# Patient Record
Sex: Female | Born: 1970
Health system: Southern US, Community
[De-identification: ages and names within clinical notes are randomized; demographics above are authoritative.]

## PROBLEM LIST (undated history)

## (undated) DIAGNOSIS — I10 Essential (primary) hypertension: Secondary | ICD-10-CM

## (undated) DIAGNOSIS — E785 Hyperlipidemia, unspecified: Secondary | ICD-10-CM

## (undated) DIAGNOSIS — IMO0002 Reserved for concepts with insufficient information to code with codable children: Secondary | ICD-10-CM

## (undated) DIAGNOSIS — R87619 Unspecified abnormal cytological findings in specimens from cervix uteri: Secondary | ICD-10-CM

## (undated) DIAGNOSIS — B977 Papillomavirus as the cause of diseases classified elsewhere: Secondary | ICD-10-CM

## (undated) DIAGNOSIS — O039 Complete or unspecified spontaneous abortion without complication: Secondary | ICD-10-CM

## (undated) HISTORY — PX: KNEE ARTHROCENTESIS: SUR44

## (undated) HISTORY — DX: Hyperlipidemia, unspecified: E78.5

## (undated) HISTORY — DX: Unspecified abnormal cytological findings in specimens from cervix uteri: R87.619

## (undated) HISTORY — PX: TUBAL LIGATION: SHX77

## (undated) HISTORY — PX: CERVICAL BIOPSY  W/ LOOP ELECTRODE EXCISION: SUR135

## (undated) HISTORY — DX: Papillomavirus as the cause of diseases classified elsewhere: B97.7

## (undated) HISTORY — DX: Reserved for concepts with insufficient information to code with codable children: IMO0002

---

## 2011-01-30 ENCOUNTER — Encounter: Payer: Self-pay | Admitting: *Deleted

## 2011-01-30 ENCOUNTER — Emergency Department (HOSPITAL_COMMUNITY)
Admission: EM | Admit: 2011-01-30 | Discharge: 2011-01-30 | Disposition: A | Discharge status: Home / Self Care | Payer: Self-pay | Attending: Emergency Medicine | Admitting: Emergency Medicine

## 2011-01-30 ENCOUNTER — Emergency Department (HOSPITAL_COMMUNITY): Payer: Self-pay

## 2011-01-30 DIAGNOSIS — I10 Essential (primary) hypertension: Secondary | ICD-10-CM | POA: Insufficient documentation

## 2011-01-30 DIAGNOSIS — R109 Unspecified abdominal pain: Secondary | ICD-10-CM | POA: Insufficient documentation

## 2011-01-30 DIAGNOSIS — O200 Threatened abortion: Secondary | ICD-10-CM

## 2011-01-30 HISTORY — DX: Essential (primary) hypertension: I10

## 2011-01-30 LAB — CBC
HCT: 39.6 % (ref 36.0–46.0)
Hemoglobin: 13.8 g/dL (ref 12.0–15.0)
MCH: 31.6 pg (ref 26.0–34.0)
MCHC: 34.8 g/dL (ref 30.0–36.0)
MCV: 90.6 fL (ref 78.0–100.0)

## 2011-01-30 LAB — WET PREP, GENITAL
Trich, Wet Prep: NONE SEEN
Yeast Wet Prep HPF POC: NONE SEEN

## 2011-01-30 LAB — URINALYSIS, ROUTINE W REFLEX MICROSCOPIC
Glucose, UA: NEGATIVE mg/dL
Ketones, ur: NEGATIVE mg/dL
Leukocytes, UA: NEGATIVE
pH: 5.5 (ref 5.0–8.0)

## 2011-01-30 LAB — POCT PREGNANCY, URINE: Preg Test, Ur: POSITIVE

## 2011-01-30 LAB — POCT I-STAT, CHEM 8
BUN: 8 mg/dL (ref 6–23)
Chloride: 105 mEq/L (ref 96–112)
Sodium: 139 mEq/L (ref 135–145)

## 2011-01-30 LAB — HCG, QUANTITATIVE, PREGNANCY: hCG, Beta Chain, Quant, S: 8563 m[IU]/mL — ABNORMAL HIGH (ref <5)

## 2011-01-30 MED ORDER — HYDROCODONE-ACETAMINOPHEN 5-500 MG PO TABS: 1.0000 | ORAL_TABLET | Freq: Four times a day (QID) | ORAL | Status: AC | PRN

## 2011-01-30 MED ORDER — MORPHINE SULFATE 4 MG/ML IJ SOLN
4.0000 mg | Freq: Once | INTRAMUSCULAR | Status: AC
  Administered 2011-01-30: 4 mg via INTRAMUSCULAR
  Filled 2011-01-30: qty 1

## 2011-01-30 MED ORDER — HYDROCODONE-ACETAMINOPHEN 5-325 MG PO TABS
1.0000 | ORAL_TABLET | Freq: Once | ORAL | Status: AC
  Administered 2011-01-30: 1 via ORAL
  Filled 2011-01-30: qty 1

## 2011-01-30 NOTE — ED Provider Notes (Signed)
History     CSN: [id number]  Arrival date & time 01/30/11  1459   First MD Initiated Contact with Patient 01/30/11 1625      Chief Complaint  Patient presents with  . Vaginal Bleeding    (Consider location/radiation/quality/duration/timing/severity/associated sxs/prior treatment) Patient is a 40 y.o. female presenting with vaginal bleeding. The history is provided by the patient.  Vaginal Bleeding This is a new problem. The current episode started yesterday. The problem occurs intermittently. Associated symptoms include abdominal pain. Pertinent negatives include no fatigue, fever, nausea, rash, urinary symptoms, vomiting or weakness. The symptoms are aggravated by nothing. She has tried nothing for the symptoms.  Pt states she is pregnant, about 8wks by LMP. States yesterday developed bright red vaginal bleeding and cramping in the lower abdomen. States this morning bleeding seemed to reslove, but then states she went to the bathroom and has a large amount of brown discharge. Denies clots or passing any tissue. Denies urinary symptoms. Denies, fever, chills, malaise.   Past Medical History  Diagnosis Date  . Hypertension     Past Surgical History  Procedure Date  . Knee arthrocentesis plate and screws    History reviewed. No pertinent family history.  History  Substance Use Topics  . Smoking status: Not on file  . Smokeless tobacco: Not on file  . Alcohol Use: No    OB History    Grav Para Term Preterm Abortions TAB SAB Ect Mult Living   1               Review of Systems  Constitutional: Negative for fever and fatigue.  HENT: Negative.   Eyes: Negative.   Respiratory: Negative.   Gastrointestinal: Positive for abdominal pain. Negative for nausea, vomiting, diarrhea, constipation and blood in stool.  Genitourinary: Positive for vaginal bleeding and pelvic pain. Negative for dysuria, frequency, hematuria and flank pain.  Musculoskeletal: Negative.   Skin: Negative  for rash.  Neurological: Negative.  Negative for weakness.  Psychiatric/Behavioral: Negative.     Allergies  Review of patient's allergies indicates no known allergies.  Home Medications   Current Outpatient Rx  Name Route Sig Dispense Refill  . ADULT MULTIVITAMIN W/MINERALS CH Oral Take 1 tablet by mouth daily.        BP 136/89  Pulse 86  Temp(Src) 98 F (36.7 C) (Oral)  Resp 18  SpO2 99%  LMP 12/01/2010  Physical Exam  Nursing note and vitals reviewed. Constitutional: She is oriented to person, place, and time. She appears well-developed and well-nourished.  HENT:  Head: Normocephalic.  Eyes: Conjunctivae are normal.  Neck: Neck supple.  Cardiovascular: Normal rate, regular rhythm and normal heart sounds.   Pulmonary/Chest: Effort normal. No respiratory distress. She has no wheezes.  Abdominal: Soft. Bowel sounds are normal.       Suprapubic tenderness, no guarding, no rebound tenderness  Genitourinary: Vagina normal. Uterus is enlarged and tender. Cervix exhibits no motion tenderness. Right adnexum displays no mass and no tenderness. Left adnexum displays no mass and no tenderness.       Vaginal bleeding in vaginal canal, with clots. Cervix appears closed.   Musculoskeletal: Normal range of motion. She exhibits no edema.  Neurological: She is alert and oriented to person, place, and time.  Skin: Skin is warm and dry.  Psychiatric: She has a normal mood and affect.    ED Course  Procedures (including critical care time)  Results for orders placed during the hospital encounter of 01/30/11  URINALYSIS,  ROUTINE W REFLEX MICROSCOPIC      Component Value Range   Color, Urine YELLOW  YELLOW    APPearance CLEAR  CLEAR    Specific Gravity, Urine 1.023  1.005 - 1.030    pH 5.5  5.0 - 8.0    Glucose, UA NEGATIVE  NEGATIVE (mg/dL)   Hgb urine dipstick MODERATE (*) NEGATIVE    Bilirubin Urine NEGATIVE  NEGATIVE    Ketones, ur NEGATIVE  NEGATIVE (mg/dL)   Protein, ur  NEGATIVE  NEGATIVE (mg/dL)   Urobilinogen, UA 0.2  0.0 - 1.0 (mg/dL)   Nitrite NEGATIVE  NEGATIVE    Leukocytes, UA NEGATIVE  NEGATIVE   HCG, QUANTITATIVE, PREGNANCY      Component Value Range   hCG, Beta Chain, Quant, S 8563 (*) <5 (mIU/mL)  ABO/RH      Component Value Range   ABO/RH(D) O POS    WET PREP, GENITAL      Component Value Range   Yeast, Wet Prep NONE SEEN  NONE SEEN    Trich, Wet Prep NONE SEEN  NONE SEEN    Clue Cells, Wet Prep NONE SEEN  NONE SEEN    WBC, Wet Prep HPF POC FEW (*) NONE SEEN   CBC      Component Value Range   WBC 10.9 (*) 4.0 - 10.5 (K/uL)   RBC 4.37  3.87 - 5.11 (MIL/uL)   Hemoglobin 13.8  12.0 - 15.0 (g/dL)   HCT 45.4  09.8 - 11.9 (%)   MCV 90.6  78.0 - 100.0 (fL)   MCH 31.6  26.0 - 34.0 (pg)   MCHC 34.8  30.0 - 36.0 (g/dL)   RDW 14.7  82.9 - 56.2 (%)   Platelets 333  150 - 400 (K/uL)  URINE MICROSCOPIC-ADD ON      Component Value Range   Squamous Epithelial / LPF FEW (*) RARE    WBC, UA 0-2  <3 (WBC/hpf)   RBC / HPF 0-2  <3 (RBC/hpf)   Bacteria, UA FEW (*) RARE   POCT PREGNANCY, URINE      Component Value Range   Preg Test, Ur POSITIVE    POCT I-STAT, CHEM 8      Component Value Range   Sodium 139  135 - 145 (mEq/L)   Potassium 3.4 (*) 3.5 - 5.1 (mEq/L)   Chloride 105  96 - 112 (mEq/L)   BUN 8  6 - 23 (mg/dL)   Creatinine, Ser 1.30  0.50 - 1.10 (mg/dL)   Glucose, Bld 86  70 - 99 (mg/dL)   Calcium, Ion 8.65  7.84 - 1.32 (mmol/L)   TCO2 23  0 - 100 (mmol/L)   Hemoglobin 14.6  12.0 - 15.0 (g/dL)   HCT 69.6  29.5 - 28.4 (%)   US Ob Comp Less 14 Wks  01/30/2011  *RADIOLOGY REPORT*  Clinical Data: Pelvic pain.  Rule out ectopic pregnancy or abortion.  Vaginal bleeding for 2 days.  Beta HCG of 8563.  OBSTETRIC <14 WK [place] AND TRANSVAGINAL OB US  Technique:  Both transabdominal and transvaginal ultrasound examinations were performed for complete evaluation of the gestation as well as the maternal uterus, adnexal regions, and pelvic cul-de-sac.   Transvaginal technique was performed to assess early pregnancy.  Comparison:  None.  Intrauterine gestational sac:  Irregularly shaped.  Positioned within the lower uterine segment.  Measures 3.5 x 3.4 cm. Endometrial thickening or blood products more superiorly. Yolk sac: Not visualized Embryo: 12 mm.  This corresponds  to 7 weeks 3 days. Cardiac Activity: Not identified, despite extensive interrogation.  CRL: 12   mm  7   w  3   d  Maternal uterus/adnexae: 2.6 cm left ovarian corpus luteal cyst.  The cervix remains closed.  IMPRESSION: 1.  Findings highly suspicious for nonviable pregnancy.  Irregular gestational sac positioned within the lower uterine segment with a 12 mm fetal pole but no evidence of cardiac activity. 2.  Left ovarian corpus luteal cyst.  Findings called to Onalee Hua, NP  Original Report Authenticated By: Consuello Bossier, M.D.   US Ob Transvaginal  01/30/2011  *RADIOLOGY REPORT*  Clinical Data: Pelvic pain.  Rule out ectopic pregnancy or abortion.  Vaginal bleeding for 2 days.  Beta HCG of 8563.  OBSTETRIC <14 WK [place] AND TRANSVAGINAL OB US  Technique:  Both transabdominal and transvaginal ultrasound examinations were performed for complete evaluation of the gestation as well as the maternal uterus, adnexal regions, and pelvic cul-de-sac.  Transvaginal technique was performed to assess early pregnancy.  Comparison:  None.  Intrauterine gestational sac:  Irregularly shaped.  Positioned within the lower uterine segment.  Measures 3.5 x 3.4 cm. Endometrial thickening or blood products more superiorly. Yolk sac: Not visualized Embryo: 12 mm.  This corresponds to 7 weeks 3 days. Cardiac Activity: Not identified, despite extensive interrogation.  CRL: 12   mm  7   w  3   d  Maternal uterus/adnexae: 2.6 cm left ovarian corpus luteal cyst.  The cervix remains closed.  IMPRESSION: 1.  Findings highly suspicious for nonviable pregnancy.  Irregular gestational sac positioned within the lower uterine segment  with a 12 mm fetal pole but no evidence of cardiac activity. 2.  Left ovarian corpus luteal cyst.  Findings called to Onalee Hua, NP  Original Report Authenticated By: Consuello Bossier, M.D.    8:45 PM [place] suspicious for nonviable pregnancy. Cervix closed. Suspect threatened miscarriage. Hgb 10.9. VS normal.  Spoke with Dr. Michele Mcalpine, will follow up outpatient in the office. Asked to call the office tomorrow. Results and follow up discussed with pt, she voiced understanding. Precautions given as to when to return to the ER.   MDM          Lottie Mussel, PA 01/30/11 2047

## 2011-01-30 NOTE — ED Notes (Signed)
Pt in u/s

## 2011-01-30 NOTE — ED Notes (Signed)
Patient states she took a pregnancy test x 1 week ago and it was positive. Patient states she went to use the restroom around 6 pm and noticed bright red blood in to toilet and on the tissue. Today she had had dark brown blood in the toilet and on the tissue when she urinates. Patient states she started having cramps yesterday and today the cramps are worse.

## 2011-01-30 NOTE — ED Notes (Signed)
G2P1. LMP OCT 9. Vag bleeding since last night. No clots or tissue observed per pt. Assisted w/ pelvic

## 2011-01-31 NOTE — ED Provider Notes (Signed)
Medical screening examination/treatment/procedure(s) were performed by non-physician practitioner and as supervising physician I was immediately available for consultation/collaboration.  Flint Melter, MD 01/31/11 Burna Mortimer

## 2011-03-05 ENCOUNTER — Emergency Department (HOSPITAL_COMMUNITY)
Admission: EM | Admit: 2011-03-05 | Discharge: 2011-03-05 | Disposition: A | Discharge status: Home / Self Care | Payer: BC Managed Care – PPO | Source: Home / Self Care | Attending: Family Medicine | Admitting: Family Medicine

## 2011-03-05 ENCOUNTER — Encounter (HOSPITAL_COMMUNITY): Payer: Self-pay | Admitting: *Deleted

## 2011-03-05 DIAGNOSIS — L509 Urticaria, unspecified: Secondary | ICD-10-CM

## 2011-03-05 DIAGNOSIS — L309 Dermatitis, unspecified: Secondary | ICD-10-CM

## 2011-03-05 DIAGNOSIS — L259 Unspecified contact dermatitis, unspecified cause: Secondary | ICD-10-CM

## 2011-03-05 HISTORY — DX: Complete or unspecified spontaneous abortion without complication: O03.9

## 2011-03-05 MED ORDER — TRIAMCINOLONE ACETONIDE 0.1 % EX OINT
TOPICAL_OINTMENT | Freq: Two times a day (BID) | CUTANEOUS | Status: AC
Start: 2011-03-05 — End: 2012-03-04

## 2011-03-05 NOTE — ED Provider Notes (Signed)
History     CSN: [id number]  Arrival date & time 03/05/11  1107   First MD Initiated Contact with Patient 03/05/11 1140      Chief Complaint  Patient presents with  . Rash    (Consider location/radiation/quality/duration/timing/severity/associated sxs/prior treatment) HPI Comments: Bridget Fields presents for evaluation of intermittent, pruritic lesions over her body, in various places. She reports new onset of several lesions on her LEFT hand today. Over the last 2 weeks, she reports lesions over her trunk, abdomen, RIGHT thigh. She reports intense itching without pain. She denies adamantly any new exposure.   Patient is a 41 y.o. female presenting with rash. The history is provided by the patient.  Rash  This is a new problem. The current episode started more than 1 week ago. The problem has not changed since onset.The problem is associated with an unknown factor. There has been no fever. The rash is present on the torso, trunk, right arm, right wrist, right upper leg and left hand. Associated symptoms include itching. Pertinent negatives include no blisters and no pain. She has tried nothing for the symptoms.    Past Medical History  Diagnosis Date  . Hypertension   . Miscarriage     Past Surgical History  Procedure Date  . Knee arthrocentesis plate and screws    History reviewed. No pertinent family history.  History  Substance Use Topics  . Smoking status: Current Everyday Smoker  . Smokeless tobacco: Not on file  . Alcohol Use: No    OB History    Grav Para Term Preterm Abortions TAB SAB Ect Mult Living   1               Review of Systems  Constitutional: Negative.   HENT: Negative.   Eyes: Negative.   Respiratory: Negative.   Cardiovascular: Negative.   Gastrointestinal: Negative.   Genitourinary: Negative.   Musculoskeletal: Negative.   Skin: Positive for itching and rash.  Neurological: Negative.     Allergies  Review of patient's allergies indicates no  known allergies.  Home Medications   Current Outpatient Rx  Name Route Sig Dispense Refill  . ALPRAZOLAM 0.25 MG PO TABS Oral Take 0.25 mg by mouth at bedtime as needed.    Marland Kitchen HYDROCHLOROTHIAZIDE 25 MG PO TABS Oral Take 12.5 mg by mouth daily.    . ADULT MULTIVITAMIN W/MINERALS CH Oral Take 1 tablet by mouth daily.      . TRIAMCINOLONE ACETONIDE 0.1 % EX OINT Topical Apply topically 2 (two) times daily. 45 g 1    BP 130/84  Pulse 82  Temp(Src) 98.4 F (36.9 C) (Oral)  Resp 16  SpO2 100%  LMP 01/30/2011  Breastfeeding? Unknown  Physical Exam  Nursing note and vitals reviewed. Constitutional: She is oriented to person, place, and time. She appears well-developed and well-nourished.  HENT:  Head: Normocephalic and atraumatic.  Eyes: EOM are normal.  Neck: Normal range of motion.  Pulmonary/Chest: Effort normal.  Musculoskeletal: Normal range of motion.  Neurological: She is alert and oriented to person, place, and time.  Skin: Skin is warm and dry. Rash noted. Rash is urticarial.     Psychiatric: Her behavior is normal.    ED Course  Procedures (including critical care time)  Labs Reviewed - No data to display No results found.   1. Dermatitis   2. Urticaria       MDM  Given rx for triamcinolone ointment        Richardo Priest,  MD 03/05/11 1232

## 2011-03-05 NOTE — ED Notes (Signed)
Pt had miscarriage December 27th - onset of rash x 2 weeks ago started on right side abd now on arms/legs and hands - itching burning sensation

## 2012-04-12 ENCOUNTER — Other Ambulatory Visit: Payer: Self-pay

## 2012-04-12 DIAGNOSIS — Z1231 Encounter for screening mammogram for malignant neoplasm of breast: Secondary | ICD-10-CM

## 2012-04-12 DIAGNOSIS — Z803 Family history of malignant neoplasm of breast: Secondary | ICD-10-CM

## 2012-05-03 ENCOUNTER — Ambulatory Visit
Admission: RE | Admit: 2012-05-03 | Discharge: 2012-05-03 | Disposition: A | Discharge status: Home / Self Care | Payer: Commercial Managed Care - PPO | Source: Ambulatory Visit

## 2012-05-03 DIAGNOSIS — Z803 Family history of malignant neoplasm of breast: Secondary | ICD-10-CM

## 2012-05-03 DIAGNOSIS — Z1231 Encounter for screening mammogram for malignant neoplasm of breast: Secondary | ICD-10-CM

## 2013-01-04 ENCOUNTER — Encounter: Payer: Self-pay | Admitting: Certified Nurse Midwife

## 2013-01-04 ENCOUNTER — Ambulatory Visit (INDEPENDENT_AMBULATORY_CARE_PROVIDER_SITE_OTHER): Payer: Commercial Managed Care - PPO | Admitting: Certified Nurse Midwife

## 2013-01-04 VITALS — BP 120/80 | HR 72 | Resp 16 | Ht 62.75 in | Wt 219.0 lb

## 2013-01-04 DIAGNOSIS — N912 Amenorrhea, unspecified: Secondary | ICD-10-CM

## 2013-01-04 DIAGNOSIS — Z3201 Encounter for pregnancy test, result positive: Secondary | ICD-10-CM

## 2013-01-04 LAB — POCT URINE PREGNANCY: Preg Test, Ur: POSITIVE

## 2013-01-04 MED ORDER — CITRANATAL HARMONY 30-1-260 MG PO CAPS: 1.0000 | ORAL_CAPSULE | Freq: Every day | ORAL | Status: DC

## 2013-01-04 NOTE — Progress Notes (Signed)
  42 y.o.Married [place] American female presents with no menses sinceOctober 21, 2014 with positive UPT here.  Currently periods are occurring every 28 days.. Planned pregnancy, but had not stopped smoking or drinking prior to conception( did not think it would happen this soon). Has stopped smoking and alcohol use now.Taking over the counter vitamins. Denies any other medication use except antacid. Denies vomiting or nausea. Complaining of breast tenderness only. Eating well balanced diet, with water and minimal coffee/ tea use. 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 Has not had flu shot yet. First pregnancy 20 years ago with pre-eclampsia. Patient and spouse very excited.  Pertinent items are noted in HPI.   O: Last aex 02/19/12 all WNL except ASCUS pap HPV negative. History of abnormal pap with LEEP in 1992 with normal paps. Healthy WDWN obese female Affect: Orientation x 3    A: Amenorrhea with positive UPT at 7 weeks per LMP, EDC 08/31/13 per LMP only History of pre eclampsia first pregnancy Smoker 1 ppd with recent cessation Alcohol use with recent cessation History abnormal pap with LEEP and ASCUS - HPVHR pap 1/14 with follow up pap with HPVHR needed 1/15  P:Discussed importance of early prenatal care and genetic screening options available. Given referral list and importance of scheduling soon. Patient plans to call today. Patient will notify our office of appointment and records will be sent. Discussed importance of good nutrition and foods to avoid in pregnancy. Warning signs and symptoms of early pregnancy given and need to advise. Stressed importance of no smoking or alcohol use and concerns with pregnancy and fetal development. Rx given for Prenatal vitamins. Wished well with pregnancy Questions addressed at length.  35 minutes spent with patient with >50% of time spent in face to face counseling.

## 2013-01-05 NOTE — Progress Notes (Signed)
Note reviewed, agree with plan.  Fuad Forget, MD  

## 2013-01-17 ENCOUNTER — Telehealth: Payer: Self-pay | Admitting: Certified Nurse Midwife

## 2013-01-17 NOTE — Telephone Encounter (Signed)
OB records sent to  [facility] OB GYN Appt 01/26/13 @10 :30

## 2013-02-18 LAB — OB RESULTS CONSOLE RUBELLA ANTIBODY, IGM: Rubella: IMMUNE

## 2013-02-18 LAB — OB RESULTS CONSOLE GC/CHLAMYDIA
CHLAMYDIA, DNA PROBE: NEGATIVE
Gonorrhea: NEGATIVE

## 2013-02-18 LAB — OB RESULTS CONSOLE HIV ANTIBODY (ROUTINE TESTING): HIV: NONREACTIVE

## 2013-02-18 LAB — OB RESULTS CONSOLE RPR: RPR: NONREACTIVE

## 2013-02-18 LAB — OB RESULTS CONSOLE HEPATITIS B SURFACE ANTIGEN: Hepatitis B Surface Ag: NEGATIVE

## 2013-03-01 ENCOUNTER — Ambulatory Visit: Payer: Self-pay | Admitting: Certified Nurse Midwife

## 2013-05-10 ENCOUNTER — Encounter (HOSPITAL_COMMUNITY): Payer: Self-pay | Admitting: *Deleted

## 2013-05-10 ENCOUNTER — Inpatient Hospital Stay (HOSPITAL_COMMUNITY)
Admission: AD | Admit: 2013-05-10 | Discharge: 2013-05-10 | Disposition: A | Discharge status: Home / Self Care | Payer: Commercial Managed Care - PPO | Source: Ambulatory Visit | Attending: Obstetrics and Gynecology | Admitting: Obstetrics and Gynecology

## 2013-05-10 DIAGNOSIS — Z8249 Family history of ischemic heart disease and other diseases of the circulatory system: Secondary | ICD-10-CM | POA: Insufficient documentation

## 2013-05-10 DIAGNOSIS — O169 Unspecified maternal hypertension, unspecified trimester: Secondary | ICD-10-CM | POA: Insufficient documentation

## 2013-05-10 DIAGNOSIS — IMO0002 Reserved for concepts with insufficient information to code with codable children: Secondary | ICD-10-CM

## 2013-05-10 DIAGNOSIS — Z87891 Personal history of nicotine dependence: Secondary | ICD-10-CM | POA: Insufficient documentation

## 2013-05-10 LAB — COMPREHENSIVE METABOLIC PANEL
ALK PHOS: 90 U/L (ref 39–117)
ALT: 38 U/L — AB (ref 0–35)
AST: 29 U/L (ref 0–37)
Albumin: 2.7 g/dL — ABNORMAL LOW (ref 3.5–5.2)
BUN: 5 mg/dL — AB (ref 6–23)
CALCIUM: 9.3 mg/dL (ref 8.4–10.5)
CO2: 25 mEq/L (ref 19–32)
Chloride: 103 mEq/L (ref 96–112)
Creatinine, Ser: 0.62 mg/dL (ref 0.50–1.10)
Glucose, Bld: 98 mg/dL (ref 70–99)
Potassium: 3.4 mEq/L — ABNORMAL LOW (ref 3.7–5.3)
Sodium: 140 mEq/L (ref 137–147)
TOTAL PROTEIN: 7 g/dL (ref 6.0–8.3)
Total Bilirubin: 0.3 mg/dL (ref 0.3–1.2)

## 2013-05-10 LAB — URINALYSIS, ROUTINE W REFLEX MICROSCOPIC
Bilirubin Urine: NEGATIVE
GLUCOSE, UA: NEGATIVE mg/dL
HGB URINE DIPSTICK: NEGATIVE
KETONES UR: NEGATIVE mg/dL
Leukocytes, UA: NEGATIVE
Nitrite: NEGATIVE
Protein, ur: NEGATIVE mg/dL
Specific Gravity, Urine: 1.01 (ref 1.005–1.030)
Urobilinogen, UA: 0.2 mg/dL (ref 0.0–1.0)
pH: 7.5 (ref 5.0–8.0)

## 2013-05-10 LAB — CBC
HEMATOCRIT: 32.5 % — AB (ref 36.0–46.0)
HEMOGLOBIN: 10.8 g/dL — AB (ref 12.0–15.0)
MCH: 30.9 pg (ref 26.0–34.0)
MCHC: 33.2 g/dL (ref 30.0–36.0)
MCV: 93.1 fL (ref 78.0–100.0)
Platelets: 312 10*3/uL (ref 150–400)
RBC: 3.49 MIL/uL — AB (ref 3.87–5.11)
RDW: 14.2 % (ref 11.5–15.5)
WBC: 14.4 10*3/uL — ABNORMAL HIGH (ref 4.0–10.5)

## 2013-05-10 LAB — LACTATE DEHYDROGENASE: LDH: 207 U/L (ref 94–250)

## 2013-05-10 LAB — URIC ACID: Uric Acid, Serum: 3.5 mg/dL (ref 2.4–7.0)

## 2013-05-10 NOTE — MAU Provider Note (Signed)
History     CSN: [id number]  Arrival date and time: 05/10/13 1245   First Provider Initiated Contact with Patient 05/10/13 1342      Chief Complaint  Patient presents with  . Hypertension   HPI  Pt is G3P1011 at [redacted]w[redacted]d GA was seen in the office this morning with BP 160/100 and is here for PIH eval. Pt has a 41 yo daughter that was delivered at 32 weeks due to hypertension.  Pt has had an uneventful pregnancy until this time. Pt has had headaches but not at this time.  Pt had some nausea and vomiting yesterday but not today.  Pt has eaten a bowl of oatmeal Today.  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   Past Medical History  Diagnosis Date  . Hypertension   . Miscarriage   . Abnormal Pap smear 1992 2014    1992 Leep, 02/2012 ASCUS HPV neg  . HPV in female     Past Surgical History  Procedure Laterality Date  . Knee arthrocentesis  plate and screws  . Cervical biopsy  w/ loop electrode excision      Family History  Problem Relation Age of Onset  . Hypertension Mother   . Diabetes Mother   . Breast cancer Mother 66  . Heart disease Father   . Hypertension Maternal Grandmother   . Hypertension Maternal Grandfather     History  Substance Use Topics  . Smoking status: Former Research scientist (life sciences)  . Smokeless tobacco: Not on file  . Alcohol Use: No    Allergies:  Allergies  Allergen Reactions  . Aspirin Hives    Prescriptions prior to admission  Medication Sig Dispense Refill  . Prenatal Vit-Fe Fumarate-FA (PRENATAL MULTIVITAMIN) TABS tablet Take 1 tablet by mouth daily at 12 noon.        Review of Systems  Constitutional: Negative for fever and chills.  Gastrointestinal: Negative for nausea, vomiting, abdominal pain and diarrhea.  Genitourinary: Negative for dysuria.  Neurological: Negative for headaches.   Physical Exam   Blood pressure 146/82, pulse 78, temperature 98.4 F (36.9 C), temperature source Oral, resp. rate 18, height 5\' 3"  (1.6 m),  weight 105.325 kg (232 lb 3.2 oz), last menstrual period 11/23/2012, SpO2 100.00%.  Physical Exam  Nursing note and vitals reviewed. Constitutional: She is oriented to person, place, and time. She appears well-developed and well-nourished. No distress.  HENT:  Head: Normocephalic.  Eyes: Pupils are equal, round, and reactive to light.  Neck: Normal range of motion. Neck supple.  Cardiovascular: Normal rate.   hypertensive  Respiratory: Effort normal.  GI: Soft.  Musculoskeletal: Normal range of motion. She exhibits no edema.  Neurological: She is alert and oriented to person, place, and time.  Skin: Skin is warm and dry.  Psychiatric: She has a normal mood and affect.    MAU Course  Procedures Results for orders placed during the hospital encounter of 05/10/13 (from the past 24 hour(s))  CBC     Status: Abnormal   Collection Time    05/10/13  1:02 PM      Result Value Ref Range   WBC 14.4 (*) 4.0 - 10.5 K/uL   RBC 3.49 (*) 3.87 - 5.11 MIL/uL   Hemoglobin 10.8 (*) 12.0 - 15.0 g/dL   HCT 32.5 (*) 36.0 - 46.0 %   MCV 93.1  78.0 - 100.0 fL   MCH 30.9  26.0 - 34.0 pg   MCHC 33.2  30.0 -  36.0 g/dL   RDW 14.2  11.5 - 15.5 %   Platelets 312  150 - 400 K/uL  COMPREHENSIVE METABOLIC PANEL     Status: Abnormal   Collection Time    05/10/13  1:02 PM      Result Value Ref Range   Sodium 140  137 - 147 mEq/L   Potassium 3.4 (*) 3.7 - 5.3 mEq/L   Chloride 103  96 - 112 mEq/L   CO2 25  19 - 32 mEq/L   Glucose, Bld 98  70 - 99 mg/dL   BUN 5 (*) 6 - 23 mg/dL   Creatinine, Ser 0.62  0.50 - 1.10 mg/dL   Calcium 9.3  8.4 - 10.5 mg/dL   Total Protein 7.0  6.0 - 8.3 g/dL   Albumin 2.7 (*) 3.5 - 5.2 g/dL   AST 29  0 - 37 U/L   ALT 38 (*) 0 - 35 U/L   Alkaline Phosphatase 90  39 - 117 U/L   Total Bilirubin 0.3  0.3 - 1.2 mg/dL   GFR calc non Af Amer >90  >90 mL/min   GFR calc Af Amer >90  >90 mL/min  URIC ACID     Status: None   Collection Time    05/10/13  1:02 PM      Result Value  Ref Range   Uric Acid, Serum 3.5  2.4 - 7.0 mg/dL  LACTATE DEHYDROGENASE     Status: None   Collection Time    05/10/13  1:02 PM      Result Value Ref Range   LDH 207  94 - 250 U/L  URINALYSIS, ROUTINE W REFLEX MICROSCOPIC     Status: None   Collection Time    05/10/13  1:10 PM      Result Value Ref Range   Color, Urine YELLOW  YELLOW   APPearance CLEAR  CLEAR   Specific Gravity, Urine 1.010  1.005 - 1.030   pH 7.5  5.0 - 8.0   Glucose, UA NEGATIVE  NEGATIVE mg/dL   Hgb urine dipstick NEGATIVE  NEGATIVE   Bilirubin Urine NEGATIVE  NEGATIVE   Ketones, ur NEGATIVE  NEGATIVE mg/dL   Protein, ur NEGATIVE  NEGATIVE mg/dL   Urobilinogen, UA 0.2  0.0 - 1.0 mg/dL   Nitrite NEGATIVE  NEGATIVE   Leukocytes, UA NEGATIVE  NEGATIVE   Discussed with Dr. Rogue Bussing- will send home with 24 hr urine starting tomorrow morning and f/u in Office to see her provider on Thursday   Assessment and Plan  Hypertension in pregnancy- nl PIH labs 24 hr urine collection and f/u in office on Thursday   Denyse Fillion 05/10/2013, 1:42 PM

## 2013-05-10 NOTE — MAU Note (Signed)
Pt sent from MD office, BP was 160/100.  Pt denies HA, visual changes, epigastric pain, or edema.  Also denies uc's bleeding or LOF.

## 2013-05-12 ENCOUNTER — Other Ambulatory Visit: Payer: Self-pay

## 2013-05-17 ENCOUNTER — Ambulatory Visit (HOSPITAL_COMMUNITY)
Admission: RE | Admit: 2013-05-17 | Discharge: 2013-05-17 | Disposition: A | Discharge status: Home / Self Care | Payer: Commercial Managed Care - PPO | Source: Ambulatory Visit | Attending: Obstetrics and Gynecology | Admitting: Obstetrics and Gynecology

## 2013-05-17 ENCOUNTER — Encounter (HOSPITAL_COMMUNITY): Payer: Self-pay | Admitting: General Practice

## 2013-05-17 ENCOUNTER — Inpatient Hospital Stay (HOSPITAL_COMMUNITY)
Admission: AD | Admit: 2013-05-17 | Discharge: 2013-05-17 | Disposition: A | Discharge status: Home / Self Care | Payer: Commercial Managed Care - PPO | Source: Ambulatory Visit | Attending: Obstetrics and Gynecology | Admitting: Obstetrics and Gynecology

## 2013-05-17 ENCOUNTER — Ambulatory Visit (HOSPITAL_COMMUNITY): Admit: 2013-05-17 | Payer: Commercial Managed Care - PPO

## 2013-05-17 ENCOUNTER — Inpatient Hospital Stay (HOSPITAL_COMMUNITY)
Admission: AD | Admit: 2013-05-17 | Discharge: 2013-05-21 | DRG: 781 | Disposition: A | Discharge status: Home / Self Care | Payer: Commercial Managed Care - PPO | Source: Ambulatory Visit | Attending: Obstetrics and Gynecology | Admitting: Obstetrics and Gynecology

## 2013-05-17 ENCOUNTER — Encounter (HOSPITAL_COMMUNITY): Payer: Self-pay | Admitting: *Deleted

## 2013-05-17 ENCOUNTER — Ambulatory Visit (HOSPITAL_COMMUNITY)
Admit: 2013-05-17 | Discharge: 2013-05-17 | Disposition: A | Discharge status: Home / Self Care | Payer: Commercial Managed Care - PPO | Attending: Obstetrics and Gynecology | Admitting: Obstetrics and Gynecology

## 2013-05-17 DIAGNOSIS — O10019 Pre-existing essential hypertension complicating pregnancy, unspecified trimester: Secondary | ICD-10-CM | POA: Insufficient documentation

## 2013-05-17 DIAGNOSIS — Z87891 Personal history of nicotine dependence: Secondary | ICD-10-CM

## 2013-05-17 DIAGNOSIS — Z833 Family history of diabetes mellitus: Secondary | ICD-10-CM

## 2013-05-17 DIAGNOSIS — Z8249 Family history of ischemic heart disease and other diseases of the circulatory system: Secondary | ICD-10-CM | POA: Insufficient documentation

## 2013-05-17 DIAGNOSIS — O09529 Supervision of elderly multigravida, unspecified trimester: Secondary | ICD-10-CM | POA: Diagnosis present

## 2013-05-17 DIAGNOSIS — O139 Gestational [pregnancy-induced] hypertension without significant proteinuria, unspecified trimester: Secondary | ICD-10-CM

## 2013-05-17 DIAGNOSIS — O99810 Abnormal glucose complicating pregnancy: Secondary | ICD-10-CM | POA: Diagnosis present

## 2013-05-17 LAB — CBC
HEMATOCRIT: 32.2 % — AB (ref 36.0–46.0)
Hemoglobin: 10.7 g/dL — ABNORMAL LOW (ref 12.0–15.0)
MCH: 30.8 pg (ref 26.0–34.0)
MCHC: 33.2 g/dL (ref 30.0–36.0)
MCV: 92.8 fL (ref 78.0–100.0)
PLATELETS: 287 10*3/uL (ref 150–400)
RBC: 3.47 MIL/uL — ABNORMAL LOW (ref 3.87–5.11)
RDW: 14.2 % (ref 11.5–15.5)
WBC: 12.2 10*3/uL — ABNORMAL HIGH (ref 4.0–10.5)

## 2013-05-17 LAB — COMPREHENSIVE METABOLIC PANEL
ALBUMIN: 2.5 g/dL — AB (ref 3.5–5.2)
ALT: 39 U/L — ABNORMAL HIGH (ref 0–35)
AST: 27 U/L (ref 0–37)
Alkaline Phosphatase: 92 U/L (ref 39–117)
BUN: 5 mg/dL — AB (ref 6–23)
CALCIUM: 9.1 mg/dL (ref 8.4–10.5)
CO2: 22 mEq/L (ref 19–32)
Chloride: 104 mEq/L (ref 96–112)
Creatinine, Ser: 0.64 mg/dL (ref 0.50–1.10)
GFR calc Af Amer: >90 mL/min (ref >90)
GFR calc non Af Amer: >90 mL/min (ref >90)
Glucose, Bld: 86 mg/dL (ref 70–99)
Potassium: 3.2 mEq/L — ABNORMAL LOW (ref 3.7–5.3)
Sodium: 139 mEq/L (ref 137–147)
TOTAL PROTEIN: 6.4 g/dL (ref 6.0–8.3)
Total Bilirubin: 0.4 mg/dL (ref 0.3–1.2)

## 2013-05-17 LAB — PROTEIN / CREATININE RATIO, URINE
CREATININE, URINE: 127.75 mg/dL
PROTEIN CREATININE RATIO: 0.16 — AB (ref 0.00–0.15)
TOTAL PROTEIN, URINE: 20.2 mg/dL

## 2013-05-17 MED ORDER — DOCUSATE SODIUM 100 MG PO CAPS
100.0000 mg | ORAL_CAPSULE | Freq: Every day | ORAL | Status: DC
  Filled 2013-05-17 (×3): qty 1

## 2013-05-17 MED ORDER — BETAMETHASONE SOD PHOS & ACET 6 (3-3) MG/ML IJ SUSP
12.0000 mg | Freq: Once | INTRAMUSCULAR | Status: DC
  Filled 2013-05-17: qty 2

## 2013-05-17 MED ORDER — BETAMETHASONE SOD PHOS & ACET 6 (3-3) MG/ML IJ SUSP
12.0000 mg | Freq: Once | INTRAMUSCULAR | Status: AC
  Administered 2013-05-17: 12 mg via INTRAMUSCULAR
  Filled 2013-05-17: qty 2

## 2013-05-17 MED ORDER — PRENATAL MULTIVITAMIN CH
1.0000 | ORAL_TABLET | Freq: Every day | ORAL | Status: DC
  Administered 2013-05-18 – 2013-05-20 (×3): 1 via ORAL
  Filled 2013-05-17 (×3): qty 1

## 2013-05-17 MED ORDER — BETAMETHASONE SOD PHOS & ACET 6 (3-3) MG/ML IJ SUSP
12.0000 mg | Freq: Once | INTRAMUSCULAR | Status: AC
  Administered 2013-05-18: 12 mg via INTRAMUSCULAR
  Filled 2013-05-17: qty 2

## 2013-05-17 MED ORDER — CALCIUM CARBONATE ANTACID 500 MG PO CHEW: 2.0000 | CHEWABLE_TABLET | ORAL | Status: DC | PRN

## 2013-05-17 MED ORDER — ACETAMINOPHEN 325 MG PO TABS: 650.0000 mg | ORAL_TABLET | ORAL | Status: DC | PRN

## 2013-05-17 MED ORDER — ZOLPIDEM TARTRATE 5 MG PO TABS: 5.0000 mg | ORAL_TABLET | Freq: Every evening | ORAL | Status: DC | PRN

## 2013-05-17 NOTE — H&P (Signed)
Pt is a 43 yr old black female, G3P0111 at 25 weeks who is admitted for HTN. Dr. Leonides Sake wrote an excellent and thorough consult. Will follow suggestions.

## 2013-05-17 NOTE — Progress Notes (Signed)
[facility] ultrasound  Indication: 43 yr old G3P0111 at [redacted]w[redacted]d with chronic hypertension with supsicion for superimposed gestational hypertension for fetal ultrasound.  Findings: 1. Single intrauterine pregnancy. 2. Estimated fetal weight is in the 61st%. 3. Anterior placenta without evidence of previa. 4. Normal amniotic fluid index; although increased for gestational age. 5. Normal transabdominal cervical length. 6. The views of the cervical spine are limited. 7. The remainder of the limited anatomy survey is normal.  Recommendations: 1. Appropriate fetal growth. 2. Limited anatomy survey: - recommend reevaluate spine on follow up ultrasound 3. Advanced maternal age: - see consult letter - had normal cell free fetal DNA 4. Slightly increased AFI: - has not had glucola yet -  in light of recent betamethasone recommend wait 1-2 weeks to do glucola - may consider checking sugars while inpatient- patient has strong family history of diabetes 5. Hypertension: - see consult letter - recommend fetal growth every 4 weeks - recommend start antenatal testing at 32 weeks or sooner if clinically indicated - delivery timing recommendations based on clinical picture  [place], MD

## 2013-05-17 NOTE — MAU Note (Signed)
PT ARRIVED  FROM DR'S OFFICE. -  BP  - 150/100,  130/92-  COLLECTED URINE    WAS IN MAU LAST WEEK-   COLLECTED 24 HR URINE-  DOES NOT KNOW RESULTS.   NO VAG BLEEDING.  VOMITED YESTERDAY. DENIES H/A, BLURRED VISION, CHEST PAIN.  NO SWELLING IN FEET/LEGS.  PT SAYS SMALL AMT IN SWELLING IN HANDS-   RINGS STILL FIT.

## 2013-05-17 NOTE — Plan of Care (Signed)
Problem: Consults Goal: Birthing Suites Patient Information Press F2 to bring up selections list   Pt < [redacted] weeks EGA     

## 2013-05-17 NOTE — Consult Note (Signed)
MFM consult  43 yr old G3P0111 at [redacted]w[redacted]d referred by Dr. Ouida Sills for consult secondary to hypertension. The patient has been evaluated twice in the last week for elevated BPs. Has had normal labs today and on 05/10/13. Patient reports no headache, vision changes, or abdominal pain  Past OB hx: prior vaginal delivery at 32 weeks for preeclampsia- 1992; 2012 first trimester SAB PMH: diagnosed with hypertension (possibly borderline) 6-7 years ago and was on a diuretic- however reports blood pressures were improved and she stopped the medication several years ago and blood pressures have been normal since PSH: knee surgery Meds: PNV Allergies: aspirin Social hx: stopped tobacco at beginning of pregnancy; no alcohol or illicit drug use Family hx: mother with diabetes; HTN runs in family  O: BP 170/96 repeat 157/94 Labs done today are normal; 24 hour urine on 05/13/13 normal with 226mg  protein in 1500cc of urine  Ultrasound today shows appropriate fetal growth and normal limited anatomy survey  I counseled the patient as follows: 1. Hypertension: - Patient likely has chronic hypertension, however discussed that her blood pressures are elevated over baseline (had been normal in pregnancy until 1 week ago) therefore unclear if worsening chronic hypertension or superimposed gestational hypertension - given uncertainty recommend admission for 24 hours to trend BPs, repeat labs in am, and obtain a urine protein/creatinine ratio- if borderline recommend repeat 24 hour urine - I discussed that women with preexistent hypertension are at increased risk of adverse pregnancy outcome, including preeclampsia, abruption, fetal growth restriction, and perinatal death. The risk increases with severity of hypertension and presence of end-organ damage. Risk of superimposed preeclampsia is 10 to 25 %; risk of abruption is 0.7 to 1.5 %; and risk of fetal growth restriction is 8 to 16 %. I also discussed that risk of preterm  delivery is increased and is usually iatrogenic from the complications listed above. Risk of preterm delivery in women with chronic hypertension is 12-34%. There is also an increased risk of requiring C section for the above reasons. .I would recommend targeting therapy to keep systolic blood pressures <017P and diastolic blood pressures <102. If patient has persistent BPs in upper 150s/100s or severe range recommend starting antihypertensive; may start with labetalol 200mg  bid. I recommend serial ultrasounds for fetal growth every 4 weeks starting at [redacted] weeks gestation. I recommend close surveillance for the development of signs/symptoms of preeclampsia. I recommend antenatal testing to start at [redacted] weeks gestation or sooner if clinically indicated. Patient is receiving a course of betamethasone.  Delivery timing recommendations based on clinical picture.  If it is determined that patient has early onset preeclampsia recommend obtain antiphospholipid antibody panel (lupus anticoagulant, anticardiolipin antibody, and beta 2 glycoprotein) especially given history of early onset preeclampsia in prior pregnancy.  Will reconsult tomorrow to determine if patient may be managed as outpatient or should continue inpatient management based on trend in blood pressures and repeat labs. 2. Advanced maternal age: - discussed increased risk of fetal aneuploidy - patient previously counseled by primary OB and had normal cell free fetal DNA  With maternal age over 37 there is increased risk of gestational diabetes, fetal growth restriction, need for Cesarean delivery, and stillbirth. Recommend serial ultrasounds for fetal growth every 4-6 weeks. Recommend starting fetal kick counts at [redacted] weeks gestation. Recommend antenatal testing with either weekly biophysical profiles or twice weekly nonstress tests and weekly amniotic fluid index starting at 32 weeks- or sooner if clinically indicated Recommend delivery by  estimated due date  I spent a total of 45 minutes with the patient of which >50% was spent in face to face consultation.  Please call with questions.  Recommendations discussed with Dr. Ouida Sills who is in agreement with the plan. Patient was sent to antepartum for admission.  [place], MD

## 2013-05-17 NOTE — MAU Provider Note (Signed)
  History     CSN: [id number]  Arrival date and time: 05/17/13 1122   None     No chief complaint on file.  HPI  Pt is a 43 yo G3P0111 at [redacted]w[redacted]d wks IUP sent over from office due to elevated blood pressure.  24 hr urine results from last week pending.  Denies vaginal bleeding or contractions.  No report of headache, vision changes or epigastric.  Here for labs and BMZ injection.     Past Medical History  Diagnosis Date  . Hypertension   . Miscarriage   . Abnormal Pap smear 1992 2014    1992 Leep, 02/2012 ASCUS HPV neg  . HPV in female     Past Surgical History  Procedure Laterality Date  . Knee arthrocentesis  plate and screws  . Cervical biopsy  w/ loop electrode excision      Family History  Problem Relation Age of Onset  . Hypertension Mother   . Diabetes Mother   . Breast cancer Mother 63  . Heart disease Father   . Hypertension Maternal Grandmother   . Hypertension Maternal Grandfather     History  Substance Use Topics  . Smoking status: Former Research scientist (life sciences)  . Smokeless tobacco: Not on file  . Alcohol Use: No    Allergies:  Allergies  Allergen Reactions  . Aspirin Hives    Prescriptions prior to admission  Medication Sig Dispense Refill  . acetaminophen (TYLENOL) 500 MG tablet Take 1,000 mg by mouth every 6 (six) hours as needed for headache.      . Prenatal Vit-Fe Fumarate-FA (PRENATAL MULTIVITAMIN) TABS tablet Take 1 tablet by mouth daily at 12 noon.        Review of Systems  Eyes: Negative for blurred vision and double vision.  Gastrointestinal: Negative for abdominal pain.  Musculoskeletal:       Hand swelling  Neurological: Negative for headaches.  All other systems reviewed and are negative.  Physical Exam   Blood pressure 143/79, pulse 81, temperature 98.1 F (36.7 C), temperature source Oral, height 5' 2.5" (1.588 m), weight 231 lb 4 oz (104.894 kg), last menstrual period 11/23/2012.  Physical Exam  Constitutional: She is oriented to  person, place, and time. She appears well-developed and well-nourished. No distress.  HENT:  Head: Normocephalic.  Neck: Normal range of motion. Neck supple.  Cardiovascular: Normal rate, regular rhythm and normal heart sounds.   Respiratory: Effort normal and breath sounds normal. No respiratory distress.  Musculoskeletal: Normal range of motion.  Neurological: She is alert and oriented to person, place, and time. She has normal reflexes. She displays normal reflexes.  Skin: Skin is warm and dry.   Dr. Ouida Sills assumes care of patient. Joelyn Oms 05/17/2013, 12:43 PM   MAU Course  Procedures   Assessment and Plan

## 2013-05-18 ENCOUNTER — Inpatient Hospital Stay (HOSPITAL_COMMUNITY): Payer: Commercial Managed Care - PPO

## 2013-05-18 DIAGNOSIS — O139 Gestational [pregnancy-induced] hypertension without significant proteinuria, unspecified trimester: Secondary | ICD-10-CM | POA: Diagnosis present

## 2013-05-18 LAB — TYPE AND SCREEN
ABO/RH(D): O POS
Antibody Screen: NEGATIVE

## 2013-05-18 LAB — GLUCOSE, CAPILLARY
GLUCOSE-CAPILLARY: 127 mg/dL — AB (ref 70–99)
GLUCOSE-CAPILLARY: 131 mg/dL — AB (ref 70–99)
Glucose-Capillary: 150 mg/dL — ABNORMAL HIGH (ref 70–99)

## 2013-05-18 LAB — CBC
HCT: 33.3 % — ABNORMAL LOW (ref 36.0–46.0)
HEMOGLOBIN: 11.1 g/dL — AB (ref 12.0–15.0)
MCH: 30.7 pg (ref 26.0–34.0)
MCHC: 33.3 g/dL (ref 30.0–36.0)
MCV: 92 fL (ref 78.0–100.0)
Platelets: 298 10*3/uL (ref 150–400)
RBC: 3.62 MIL/uL — ABNORMAL LOW (ref 3.87–5.11)
RDW: 14.1 % (ref 11.5–15.5)
WBC: 20.3 10*3/uL — ABNORMAL HIGH (ref 4.0–10.5)

## 2013-05-18 LAB — COMPREHENSIVE METABOLIC PANEL
ALBUMIN: 2.5 g/dL — AB (ref 3.5–5.2)
ALK PHOS: 94 U/L (ref 39–117)
ALT: 38 U/L — ABNORMAL HIGH (ref 0–35)
AST: 26 U/L (ref 0–37)
BILIRUBIN TOTAL: 0.5 mg/dL (ref 0.3–1.2)
BUN: 5 mg/dL — AB (ref 6–23)
CO2: 21 mEq/L (ref 19–32)
Calcium: 8.8 mg/dL (ref 8.4–10.5)
Chloride: 102 mEq/L (ref 96–112)
Creatinine, Ser: 0.56 mg/dL (ref 0.50–1.10)
GFR calc non Af Amer: >90 mL/min (ref >90)
GLUCOSE: 102 mg/dL — AB (ref 70–99)
POTASSIUM: 3.4 meq/L — AB (ref 3.7–5.3)
Sodium: 139 mEq/L (ref 137–147)
TOTAL PROTEIN: 6.1 g/dL (ref 6.0–8.3)

## 2013-05-18 LAB — URIC ACID: Uric Acid, Serum: 3.7 mg/dL (ref 2.4–7.0)

## 2013-05-18 LAB — CREATININE CLEARANCE, URINE, 24 HOUR
CREATININE 24H UR: 1817 mg/d — AB (ref 700–1800)
CREATININE, URINE: 171.39 mg/dL
Collection Interval-CRCL: 24 hours
Creatinine Clearance: 225 mL/min — ABNORMAL HIGH (ref 75–115)
Creatinine: 0.56 mg/dL (ref 0.50–1.10)
URINE TOTAL VOLUME-CRCL: 1060 mL

## 2013-05-18 LAB — ABO/RH: ABO/RH(D): O POS

## 2013-05-18 MED ORDER — LABETALOL HCL 200 MG PO TABS
200.0000 mg | ORAL_TABLET | Freq: Two times a day (BID) | ORAL | Status: DC
  Administered 2013-05-18 – 2013-05-20 (×6): 200 mg via ORAL
  Filled 2013-05-18 (×7): qty 1

## 2013-05-18 NOTE — Progress Notes (Signed)
Ur chart review completed.  

## 2013-05-18 NOTE — Progress Notes (Signed)
43 y.o. [id number] [redacted]w[redacted]d HD#1 admitted for 25 WEEKS ELEVATED BP.  Pt currently stable with no c/o s/s preeclampsia.  Good FM.  Filed Vitals:   05/17/13 1600 05/17/13 2000 05/17/13 2003 05/17/13 2309  BP: 148/87 169/84 158/77 146/82  Pulse: 80 90 90 86  Temp: 98.6 F (37 C)  98.4 F (36.9 C) 98.5 F (36.9 C)  TempSrc: Oral  Oral Oral  Resp: 18 18 18 18   Height: 5\' 3"  (1.6 m)     Weight: 105.235 kg (232 lb)       Lungs CTA Cor RRR Abd  Soft, gravid, nontender Ex SCDs FHTs  140s, good short term variability, NST R last pm Toco  none  Results for orders placed during the hospital encounter of 05/17/13 (from the past 24 hour(s))  PROTEIN / CREATININE RATIO, URINE     Status: Abnormal   Collection Time    05/17/13  5:35 PM      Result Value Ref Range   Creatinine, Urine 127.75     Total Protein, Urine 20.2     PROTEIN CREATININE RATIO 0.16 (*) 0.00 - 0.15  CBC     Status: Abnormal   Collection Time    05/18/13  5:40 AM      Result Value Ref Range   WBC 20.3 (*) 4.0 - 10.5 K/uL   RBC 3.62 (*) 3.87 - 5.11 MIL/uL   Hemoglobin 11.1 (*) 12.0 - 15.0 g/dL   HCT 33.3 (*) 36.0 - 46.0 %   MCV 92.0  78.0 - 100.0 fL   MCH 30.7  26.0 - 34.0 pg   MCHC 33.3  30.0 - 36.0 g/dL   RDW 14.1  11.5 - 15.5 %   Platelets 298  150 - 400 K/uL  COMPREHENSIVE METABOLIC PANEL     Status: Abnormal   Collection Time    05/18/13  5:40 AM      Result Value Ref Range   Sodium 139  137 - 147 mEq/L   Potassium 3.4 (*) 3.7 - 5.3 mEq/L   Chloride 102  96 - 112 mEq/L   CO2 21  19 - 32 mEq/L   Glucose, Bld 102 (*) 70 - 99 mg/dL   BUN 5 (*) 6 - 23 mg/dL   Creatinine, Ser 0.56  0.50 - 1.10 mg/dL   Calcium 8.8  8.4 - 10.5 mg/dL   Total Protein 6.1  6.0 - 8.3 g/dL   Albumin 2.5 (*) 3.5 - 5.2 g/dL   AST 26  0 - 37 U/L   ALT 38 (*) 0 - 35 U/L   Alkaline Phosphatase 94  39 - 117 U/L   Total Bilirubin 0.5  0.3 - 1.2 mg/dL   GFR calc non Af Amer >90  >90 mL/min   GFR calc Af Amer >90  >90 mL/min  URIC ACID      Status: None   Collection Time    05/18/13  5:40 AM      Result Value Ref Range   Uric Acid, Serum 3.7  2.4 - 7.0 mg/dL    A:  HD#1  [redacted]w[redacted]d with probable chronic HTN with protein/cr ratio of 1.6 and 24 hour urine with 226 mg protein.  AST/ALT have now normalized and no other signs of severe preeclampsia.  P: Per MFM: " 43 yr old G3P0111 at [redacted]w[redacted]d with chronic hypertension with supsicion for superimposed gestational hypertension for fetal ultrasound.  Findings:  1. Single intrauterine pregnancy.  2. Estimated fetal weight is in  the 61st%.  3. Anterior placenta without evidence of previa.  4. Normal amniotic fluid index; although increased for gestational age.  5. Normal transabdominal cervical length.  6. The views of the cervical spine are limited.  7. The remainder of the limited anatomy survey is normal.  Recommendations:  1. Appropriate fetal growth.  2. Limited anatomy survey:  - recommend reevaluate spine on follow up ultrasound  3. Advanced maternal age:  - see consult letter  - had normal cell free fetal DNA  4. Slightly increased AFI:  - has not had glucola yet  - in light of recent betamethasone recommend wait 1-2 weeks to do glucola  - may consider checking sugars while inpatient- patient has strong family history of diabetes  5. Hypertension:  - see consult letter  - recommend fetal growth every 4 weeks  - recommend start antenatal testing at 32 weeks or sooner if clinically indicated  - delivery timing recommendations based on clinical picture "   Pt received BMZ- second dose due at 12:30 pm. Has not started labetalol yet- will start today.   24 hour in progress again- expect to do antiphospholipid antibody panel if proteinuria is worse that 226 mg.   Will delay 1 hour gtt until 2 weeks post BMZ; will do random sugars while admitted for spot monitoring.    [name]

## 2013-05-18 NOTE — Consult Note (Signed)
MFM Consult Addendum (Chart Review)  I was asked by Dr. Leonides Sake and Dr. Philis Pique to review labs from yesterday and blood pressures today to confirm Dr. Juliann Pares impression.  Indeed, the patient continued to have severe range hypertension but has yet to demonstrate significant proteinuria (300mg  and spot P:C ratio <0.7).  Regardless of significant proteinuria, the patient should be managed the same (severe preeclampsia vs. Severe Gestational hypertension).   Impression:  Severe Gestational hypertension superimposed on chronic hypertension Recommendations: 1. Continued admission to the hospital until delivery 2. Twice weekly preeclampsia labs 3. Daily EFM 4. Serial physical exams/vital signs 5. Initiate weekly BPP/AFI at 28-30 weeks while in house with severe gestational HTN 6. Interval growth monthly 7. Delivery by 34 weeks if not indicated sooner.  Please call our service if further advice or involvement is desired.  Given Dr. Juliann Pares consult yesterday and request to follow up lab results and BP's, I did not charge for my review of the chart as I am simply addending and confirming her impression and recommendation (addendum to her consultation).  Thank you,  Delman Cheadle Harl Favor, Delman Cheadle, MD, MS, FACOG Assistant Professor Section of [place]

## 2013-05-18 NOTE — Progress Notes (Signed)
Reactive NST 

## 2013-05-19 LAB — GLUCOSE, CAPILLARY
GLUCOSE-CAPILLARY: 104 mg/dL — AB (ref 70–99)
GLUCOSE-CAPILLARY: 130 mg/dL — AB (ref 70–99)
GLUCOSE-CAPILLARY: 108 mg/dL — AB (ref 70–99)
Glucose-Capillary: 113 mg/dL — ABNORMAL HIGH (ref 70–99)

## 2013-05-19 LAB — PROTEIN, URINE, 24 HOUR
Collection Interval-UPROT: 24 hours
PROTEIN 24H UR: 254 mg/d — AB (ref 50–100)
Protein, Urine: 24 mg/dL
Urine Total Volume-UPROT: 1060 mL

## 2013-05-19 NOTE — Progress Notes (Signed)
  Nutrition Dx: Food and nutrition-related knowledge deficit r/t no previous education aeb newly diagnosed GDM.    Nutrition education consult for Carbohydrate Modified Gestational Diabetic Diet completed.  "Meal  plan for gestational diabetics" handout given to patient.  Basic concepts reviewed.  Questions answered.  Patient verbalizes understanding.  Bridget Fields LDN Neonatal Nutrition Support Specialist Pager [phone]

## 2013-05-19 NOTE — Progress Notes (Signed)
HD#3 43yo [id number] at [redacted]w[redacted]d with probable CHTN and suspicion for superimposed severe gestational HTN per MFM. Bridget Fields denies CP/SOB/RUQ pain/vision changes or HA.  She reports good FM and denies bleeding or LOF.  She was hoping for discharge today.  Filed Vitals:   05/18/13 2303 05/19/13 0805 05/19/13 1036 05/19/13 1155  BP: 147/69 148/85 154/78 130/69  Pulse: 97 96 90 95  Temp:  98.2 F (36.8 C)  98.6 F (37 C)  TempSrc:  Oral  Oral  Resp: 18 18 18 18   Height:      Weight:        Lab Results  Component Value Date   WBC 20.3* 05/18/2013   HGB 11.1* 05/18/2013   HCT 33.3* 05/18/2013   MCV 92.0 05/18/2013   PLT 298 05/18/2013   Other labs: 05/10/13: AST/ALT: 29/38, Plt 312, UA neg protein 05/13/13: 24hr urine protein 226mg    05/17/13: AST/ALT: 27/39, Plt 287, random  gluose 86, Prot/Cr ratio: 0.16, 24hr urine protein 254mg  Capillary glucose: 05/18/13: fasting 102, 2hr pp: 150, 127, 131 05/19/13: fasting 104, 2hr pp: 130  [place] 05/17/13: Findings:  1. Single intrauterine pregnancy.  2. Estimated fetal weight is in the 61st%.  3. Anterior placenta without evidence of previa.  4. Normal amniotic fluid index; although increased for gestational age.  5. Normal transabdominal cervical length.  6. The views of the cervical spine are limited.  7. The remainder of the limited anatomy survey is normal.   A: HD#3 [redacted]w[redacted]d with probable chronic HTN  1. Appropriate fetal growth P: Per MFM, combined recommendations of Dr. Leonides Sake, Dr. Margurite Auerbach and Dr. Leslee Home: 1. Limited anatomy survey:  recommend reevaluate spine on follow up ultrasound  2. Advanced maternal age: see consult letter, had normal cell free fetal DNA  3. Slightly increased AFI:  - has not had glucola yet  - in light of recent betamethasone recommend wait 1-2 weeks to do glucola (between 05/25/13 and 06/01/13) - may consider checking sugars while inpatient- patient has strong family history of diabetes, fasting and 2hr pp elevated for 4/15 and  4/16 so far, will change patient to GDM diet and continue to monitor, will ask for nutrition consult to help with meal planning until glucola is performed 4. Hypertension:  - see consult letter  - continue Labetalol 200bid - Twice weekly preeclampsia labs (due 4/18) - Daily EFM (currently ordered for NST qshift) - recommend fetal growth every 4 weeks (due approx. 06/14/13) - recommend start  BPP/AFI at 28-30 weeks while in house with severe gestational HTN  - Continued admission to the hospital until delivery recommended by Dr. Margurite Auerbach, confirmed with Dr. Leslee Home - if pt's BPs are stable for one week, may considered outpt mgmt. This was discussed with pt today.  - Serial physical exams/vital signs   - Delivery by 34 weeks if not indicated sooner.

## 2013-05-20 LAB — GLUCOSE, CAPILLARY
GLUCOSE-CAPILLARY: 129 mg/dL — AB (ref 70–99)
Glucose-Capillary: 92 mg/dL (ref 70–99)
Glucose-Capillary: 99 mg/dL (ref 70–99)
Glucose-Capillary: 104 mg/dL — ABNORMAL HIGH (ref 70–99)

## 2013-05-20 NOTE — Progress Notes (Signed)
Patient ID: Nalea Salce, female   DOB: 08-Apr-1970, 43 y.o.   MRN: [id number] Name: Cassie Shedlock Medical Record Number:  [id number] Date of Birth: October 02, 1970 Date of Service: 05/20/2013  Joanna Hews HD#4 admitted for preterm uncontrolled Chronic HTN with superimposed PIH.  The patient is a 43 y.o. H4V4259, with an EDD of 08/29/2013.  Patient with mildly elevated LFTs (ALT), 24 hr urine and protein / creatinine ration not c/w Pre-E Patient w/o complaints today, no HA, no blurry vision or scotomata, no RUQ pain.  She is tolerating PO, no N/V. No chest pain or SOB.  She feels good fetal movements, no ctx, bleeding or LOF    Physical Examination:  05/19/13 2248 98.3 F (36.8 C) 93 -- 18 139/69 mmHg   05/19/13 2027 98.3 F (36.8 C) 87 -- 18 ! 149/73 mmHg - 004/16/15 1553 98.6 F (37 C) 97 -- -- ! 142/74 mmHg   05/19/13 1155 98.6 F (37 C) 95 -- 18 130/69 mmHg  05/19/13 1036 -- 90 -- 18 ! 154/78 mmHg  05/19/13 0805 98.2 F (36.8 C) 96 -- 18 ! 148/85 mmHg --  General appearance - alert, well appearing, and in no distress, oriented to person, place, and time and well hydrated Mental status - alert, oriented to person, place, and time, normal mood, behavior, speech, dress, motor activity, and thought processes Abdomen - Gravid, soft, nontender, nondistended, no masses or organomegaly Pelvic - examination not indicated Neuro: DTRs 2+ no clonus Extremities - peripheral pulses normal, no pedal edema, no clubbing or cyanosis, no pedal edema noted  EFM: Baseline 150 moderate variability appropriate for gestational age Cat I Toco: no ctx  The patient's past medical history and prenatal records were reviewed.  Additional issues addressed and updated today: Patient Active Problem List   Diagnosis Date Noted  . PIH (pregnancy induced hypertension) 05/18/2013   Family History  Problem Relation Age of Onset  . Hypertension Mother   . Diabetes Mother   . Breast cancer Mother 33  . Heart disease  Father   . Hypertension Maternal Grandmother   . Hypertension Maternal Grandfather    History   Social History  . Marital Status: Married    Spouse Name: N/A    Number of Children: N/A  . Years of Education: N/A   Social History Main Topics  . Smoking status: Former Research scientist (life sciences)  . Smokeless tobacco: None  . Alcohol Use: No  . Drug Use: No  . Sexual Activity: Yes    Birth Control/ Protection: None   Other Topics Concern  . None   Social History Narrative  . None   Lab Results    Component  Value  Date     WBC  20.3*  05/18/2013     HGB  11.1*  05/18/2013     HCT  33.3*  05/18/2013     MCV  92.0  05/18/2013     PLT  298  05/18/2013     Other labs:  05/10/13: AST/ALT: 29/38, Plt 312, UA neg protein  05/13/13: 24hr urine protein 226mg   05/17/13: AST/ALT: 27/39, Plt 287, random gluose 86, Prot/Cr ratio: 0.16, 24hr urine protein 254mg       [place] 05/17/13:  Findings:  1. Single intrauterine pregnancy.  2. Estimated fetal weight is in the 61st%.  3. Anterior placenta without evidence of previa.  4. Normal amniotic fluid index; although increased for gestational age.  5. Normal transabdominal cervical length.  6. The views of the cervical spine are  limited.  7. The remainder of the limited anatomy survey is normal.   A: HD#3 [redacted]w[redacted]d with Chronic HTN / superimposed PIH BP improved overnight. Patient counseled that if her BP is good today will talk with MFM about outpatient management She is presently on Labetalol 200 BID.  MFM noted inpatient management until delivery but will ask if she can be discharged as patient  desires to go home. She notes that she takes her BP at home Outpatient management would include serial Growth scans, twice weekly NST, serial labs. Co-management with MFM  - Delivery by 34 weeks if not indicated sooner.         The patient was counseled as to her risk of Severe pre eclampsia and she is aware of the signs and symptoms.    Rogelio Seen Bowen Goyal

## 2013-05-20 NOTE — Progress Notes (Signed)
Pt left unit with FOB for W/c ride  Stated she needed some fresh air

## 2013-05-20 NOTE — Progress Notes (Signed)
Pt upset with MFM and Dr. Alwyn Pea regarding POC

## 2013-05-21 LAB — GLUCOSE, CAPILLARY: Glucose-Capillary: 91 mg/dL (ref 70–99)

## 2013-05-21 MED ORDER — DSS 100 MG PO CAPS: 100.0000 mg | ORAL_CAPSULE | Freq: Every day | ORAL | Status: DC

## 2013-05-21 MED ORDER — LABETALOL HCL 200 MG PO TABS: 200.0000 mg | ORAL_TABLET | Freq: Two times a day (BID) | ORAL | Status: DC

## 2013-05-21 MED ORDER — FERROUS SULFATE 324 (65 FE) MG PO TBEC: 1.0000 | DELAYED_RELEASE_TABLET | Freq: Every day | ORAL | Status: DC

## 2013-05-21 NOTE — Discharge Summary (Signed)
Obstetric Discharge Summary Reason for Admission: Severe high blood pressure Prenatal Procedures: NST and ultrasound Intrapartum Procedures: none Postpartum Procedures: N/A Complications-Operative and Postpartum:N/A Hemoglobin  Date Value Ref Range Status  05/18/2013 11.1* 12.0 - 15.0 g/dL Final     HCT  Date Value Ref Range Status  05/18/2013 33.3* 36.0 - 46.0 % Final   Physical Examination:  05/21/13 0556 98.5 F (36.9 C) 90 -- 20 ! 148/64 mmHg -- -- -- -- RF 05/20/13 1947 98.3 F (36.8 C) ! 102 -- 20 ! 155/76 mmHg -- -- -- -- RF 05/20/13 1710 98.5 F (36.9 C) 94 -- 20 ! 146/74 mmHg -- -- -- -- LB 05/20/13 1142 98.5 F (36.9 C) 91 -- 18 ! 142/71 mmHg -- -- -- -- LB 05/20/13 0958 -- 85 -- 18 121/61 mmHg -- -- -- -- LB 05/20/13 0830 98.1 F (36.7 C) 92 -- 18 ! 132/54 mmHg  General appearance - alert, well appearing, and in no distress, oriented to person, place, and time and well hydrated  Mental status - alert, oriented to person, place, and time, normal mood, behavior, speech, dress, motor activity, and thought processes  Abdomen - Gravid, soft, nontender, nondistended, no masses or organomegaly  Pelvic - examination not indicated  Neuro: DTRs 2+ no clonus  Extremities - peripheral pulses normal, no pedal edema, no clubbing or cyanosis, no pedal edema noted  EFM: Baseline 150 moderate variability appropriate for gestational age Cat I  Toco: no ctx   The patient's past medical history and prenatal records were reviewed. Additional issues addressed and updated today:  Patient Active Problem List    Diagnosis  Date Noted   .  PIH (pregnancy induced hypertension)  05/18/2013    Family History   Problem  Relation  Age of Onset   .  Hypertension  Mother    .  Diabetes  Mother    .  Breast cancer  Mother  43   .  Heart disease  Father    .  Hypertension  Maternal Grandmother    .  Hypertension  Maternal Grandfather     History    Social History   .  Marital Status:  Married      Spouse Name:  N/A     Number of Children:  N/A   .  Years of Education:  N/A    Social History Main Topics   .  Smoking status:  Former Research scientist (life sciences)   .  Smokeless tobacco:  None   .  Alcohol Use:  No   .  Drug Use:  No   .  Sexual Activity:  Yes     Birth Control/ Protection:  None    Other Topics  Concern   .  None    Social History Narrative   .  None    Lab Results    Component  Value  Date     WBC  20.3*  05/18/2013     HGB  11.1*  05/18/2013     HCT  33.3*  05/18/2013     MCV  92.0  05/18/2013     PLT  298  05/18/2013    Other labs:  05/10/13: AST/ALT: 29/38, Plt 312, UA neg protein  05/13/13: 24hr urine protein 226mg   05/17/13: AST/ALT: 27/39, Plt 287, random gluose 86, Prot/Cr ratio: 0.16, 24hr urine protein 254mg   [place] 05/17/13:  Findings:  1. Single intrauterine pregnancy.  2. Estimated fetal weight is in the 61st%.  3.  Anterior placenta without evidence of previa.  4. Normal amniotic fluid index; although increased for gestational age.  5. Normal transabdominal cervical length.  6. The views of the cervical spine are limited.  7. The remainder of the limited anatomy survey is normal.   A: HD#5 [redacted]w[redacted]d with Chronic HTN / superimposed PIH   Patient wants to sign out against medical advice.  Discussed case with MFM Dr. Margurite Auerbach yesterday and  Due to the transaminitis, his recommendation was to keep patient in house as a cautionary measure and to start a work up for possible other causes of the ALT abnormality (ie hepatitis, GB etc. ) He recommended obtaining a GI consultation.   BP is mild range. Patient without s/sx of pre-eclampsia. We discussed at length that the recommendation by MFM is for her to be present in the hospital in until delivery as she could have an increased risk of eclamptic seizure, stroke at home.  Patient voiced understanding and she still is leaving the hospital.   Discussed with patient that I will do standard discharge so she will not have to sign AMA  forms. She was given strict pre-E precautions. She has BP cuff at home and will take her BP 3x / day. She is at home and is not working. She will come to office first thing next week for BP check and evaluation.  Will perform weekly LFTs,  Rx for labetalol 200 BID, Iron, Colace Growth scans q4 weeks NSTs starting at 30 weeks.  Patient to have Glucola in office in 3 weeks.        The patient was counseled as to her risk of Severe pre eclampsia and she is aware of the signs and symptoms. We discussed risk  Of stroke, seizure, maternal or fetal death.   Rogelio Seen Kazuko Clemence Hrithik Boschee 05/21/2013, 7:42 AM

## 2013-05-21 NOTE — Progress Notes (Signed)
Pt teaching completed with teach back. Patient discharged home by self. Pt to call Monday to office for follow up appointment. Made aware of s/s to report and return for. Prescriptions to patient.

## 2013-05-21 NOTE — Progress Notes (Signed)
Patient ID: Bridget Fields, female   DOB: 05/04/1970, 43 y.o.   MRN: [id number]   Bera Pinela HD#5 admitted for preterm uncontrolled Chronic HTN with superimposed PIH.  The patient is a 43 y.o. [id number], 25 weeks 5 d with an EDD of 08/29/2013.  Patient with mildly elevated LFTs (ALT), 24 hr urine and protein / creatinine ration not c/w Pre-E  Patient w/o complaints today, no HA, no blurry vision or scotomata, no RUQ pain.  She is tolerating PO, no N/V. No chest pain or SOB. She feels good fetal movements, no ctx, bleeding or LOF   Physical Examination:  05/21/13 0556 98.5 F (36.9 C) 90 -- 20 ! 148/64 mmHg -- -- -- -- RF 05/20/13 1947 98.3 F (36.8 C) ! 102 -- 20 ! 155/76 mmHg -- -- -- -- RF 05/20/13 1710 98.5 F (36.9 C) 94 -- 20 ! 146/74 mmHg -- -- -- -- LB 05/20/13 1142 98.5 F (36.9 C) 91 -- 18 ! 142/71 mmHg -- -- -- -- LB 05/20/13 0958 -- 85 -- 18 121/61 mmHg -- -- -- -- LB 05/20/13 0830 98.1 F (36.7 C) 92 -- 18 ! 132/54 mmHg  General appearance - alert, well appearing, and in no distress, oriented to person, place, and time and well hydrated  Mental status - alert, oriented to person, place, and time, normal mood, behavior, speech, dress, motor activity, and thought processes  Abdomen - Gravid, soft, nontender, nondistended, no masses or organomegaly  Pelvic - examination not indicated  Neuro: DTRs 2+ no clonus  Extremities - peripheral pulses normal, no pedal edema, no clubbing or cyanosis, no pedal edema noted  EFM: Baseline 150 moderate variability appropriate for gestational age Cat I  Toco: no ctx   The patient's past medical history and prenatal records were reviewed. Additional issues addressed and updated today:  Patient Active Problem List    Diagnosis  Date Noted   .  PIH (pregnancy induced hypertension)  05/18/2013    Family History   Problem  Relation  Age of Onset   .  Hypertension  Mother    .  Diabetes  Mother    .  Breast cancer  Mother  34   .  Heart disease   Father    .  Hypertension  Maternal Grandmother    .  Hypertension  Maternal Grandfather     History    Social History   .  Marital Status:  Married     Spouse Name:  N/A     Number of Children:  N/A   .  Years of Education:  N/A    Social History Main Topics   .  Smoking status:  Former Research scientist (life sciences)   .  Smokeless tobacco:  None   .  Alcohol Use:  No   .  Drug Use:  No   .  Sexual Activity:  Yes     Birth Control/ Protection:  None    Other Topics  Concern   .  None    Social History Narrative   .  None    Lab Results    Component  Value  Date     WBC  20.3*  05/18/2013     HGB  11.1*  05/18/2013     HCT  33.3*  05/18/2013     MCV  92.0  05/18/2013     PLT  298  05/18/2013    Other labs:  05/10/13: AST/ALT: 29/38, Plt 312, UA neg protein  05/13/13: 24hr urine protein 226mg   05/17/13: AST/ALT: 27/39, Plt 287, random gluose 86, Prot/Cr ratio: 0.16, 24hr urine protein 254mg   [place] 05/17/13:  Findings:  1. Single intrauterine pregnancy.  2. Estimated fetal weight is in the 61st%.  3. Anterior placenta without evidence of previa.  4. Normal amniotic fluid index; although increased for gestational age.  5. Normal transabdominal cervical length.  6. The views of the cervical spine are limited.  7. The remainder of the limited anatomy survey is normal.   A: HD#5 [redacted]w[redacted]d with Chronic HTN / superimposed PIH   Patient wants to sign out against medical advice.  Discussed case with MFM Dr. Margurite Auerbach yesterday and  Due to the transaminitis, his recommendation was to keep patient in house as a cautionary measure and to start a work up for possible other causes of the ALT abnormality (ie hepatitis, GB etc. ) He recommended obtaining a GI consultation.   BP is mild range. Patient without s/sx of pre-eclampsia. We discussed at length that the recommendation by MFM is for her to be present in the hospital in until delivery as she could have an increased risk of eclamptic seizure, stroke at home.  Patient  voiced understanding and she still is leaving the hospital.   Discussed with patient that I will do standard discharge so she will not have to sign AMA forms. She was given strict pre-E precautions. She has BP cuff at home and will take her BP 3x / day. She is at home and is not working. She will come to office first thing next week for BP check and evaluation.  Will perform weekly LFTs,  Rx for labetalol 200 BID, Iron, Colace Growth scans q4 weeks NSTs starting at 30 weeks.  Patient to have Glucola in office in 3 weeks.        The patient was counseled as to her risk of Severe pre eclampsia and she is aware of the signs and symptoms. We discussed risk  Of stroke, seizure, maternal or fetal death.   Rogelio Seen Cherl Gorney

## 2013-05-21 NOTE — Discharge Instructions (Signed)
Hypertension During Pregnancy °Hypertension is also called high blood pressure. Blood pressure moves blood in your body. Sometimes, the force that moves the blood becomes too strong. When you are pregnant, this condition should be watched carefully. It can cause problems for you and your baby. °HOME CARE  °· Make and keep all of your doctor visits. °· Take medicine as told by your doctor. Tell your doctor about all medicines you take. °· Eat very little salt. °· Exercise regularly. °· Do not drink alcohol. °· Do not smoke. °· Do not have drinks with caffeine. °· Lie on your left side when resting. °GET HELP RIGHT AWAY IF: °· You have bad belly (abdominal) pain. °· You have sudden puffiness (swelling) in the hands, ankles, or face. °· You gain 4 pounds (1.8 kilograms) or more in 1 week. °· You throw up (vomit) repeatedly. °· You have bleeding from the vagina. °· You do not feel the baby moving as much. °· You have a headache. °· You have blurred or double vision. °· You have muscle twitching or spasms. °· You have shortness of breath. °· You have blue fingernails and lips. °· You have blood in your pee (urine). °MAKE SURE YOU: °· Understand these instructions. °· Will watch your condition. °· Will get help right away if you are not doing well or get worse. °Document Released: 02/22/2010 Document Revised: 11/10/2012 Document Reviewed: 08/19/2012 °Exit[employer]® Patient Information ©2014 [employer]. ° °

## 2013-06-01 ENCOUNTER — Inpatient Hospital Stay (HOSPITAL_COMMUNITY)
Admission: AD | Admit: 2013-06-01 | Discharge: 2013-07-06 | DRG: 781 | Disposition: A | Discharge status: Home / Self Care | Payer: Commercial Managed Care - PPO | Source: Ambulatory Visit | Attending: Obstetrics and Gynecology | Admitting: Obstetrics and Gynecology

## 2013-06-01 ENCOUNTER — Encounter (HOSPITAL_COMMUNITY): Payer: Self-pay | Admitting: *Deleted

## 2013-06-01 DIAGNOSIS — D259 Leiomyoma of uterus, unspecified: Secondary | ICD-10-CM | POA: Diagnosis present

## 2013-06-01 DIAGNOSIS — R12 Heartburn: Secondary | ICD-10-CM | POA: Diagnosis present

## 2013-06-01 DIAGNOSIS — O212 Late vomiting of pregnancy: Secondary | ICD-10-CM | POA: Diagnosis present

## 2013-06-01 DIAGNOSIS — IMO0002 Reserved for concepts with insufficient information to code with codable children: Principal | ICD-10-CM | POA: Diagnosis present

## 2013-06-01 DIAGNOSIS — O3410 Maternal care for benign tumor of corpus uteri, unspecified trimester: Secondary | ICD-10-CM | POA: Diagnosis present

## 2013-06-01 DIAGNOSIS — R945 Abnormal results of liver function studies: Secondary | ICD-10-CM

## 2013-06-01 DIAGNOSIS — O133 Gestational [pregnancy-induced] hypertension without significant proteinuria, third trimester: Secondary | ICD-10-CM

## 2013-06-01 DIAGNOSIS — Z803 Family history of malignant neoplasm of breast: Secondary | ICD-10-CM

## 2013-06-01 DIAGNOSIS — I10 Essential (primary) hypertension: Secondary | ICD-10-CM | POA: Diagnosis present

## 2013-06-01 DIAGNOSIS — Z8249 Family history of ischemic heart disease and other diseases of the circulatory system: Secondary | ICD-10-CM

## 2013-06-01 DIAGNOSIS — R7989 Other specified abnormal findings of blood chemistry: Secondary | ICD-10-CM

## 2013-06-01 DIAGNOSIS — Z87891 Personal history of nicotine dependence: Secondary | ICD-10-CM

## 2013-06-01 DIAGNOSIS — O139 Gestational [pregnancy-induced] hypertension without significant proteinuria, unspecified trimester: Secondary | ICD-10-CM

## 2013-06-01 DIAGNOSIS — O09529 Supervision of elderly multigravida, unspecified trimester: Secondary | ICD-10-CM | POA: Diagnosis present

## 2013-06-01 DIAGNOSIS — Z833 Family history of diabetes mellitus: Secondary | ICD-10-CM

## 2013-06-01 DIAGNOSIS — O169 Unspecified maternal hypertension, unspecified trimester: Secondary | ICD-10-CM

## 2013-06-01 LAB — PROTEIN / CREATININE RATIO, URINE
CREATININE, URINE: 52.33 mg/dL
PROTEIN CREATININE RATIO: 0.11 (ref 0.00–0.15)
Total Protein, Urine: 5.7 mg/dL

## 2013-06-01 LAB — COMPREHENSIVE METABOLIC PANEL
ALBUMIN: 2.5 g/dL — AB (ref 3.5–5.2)
ALT: 41 U/L — ABNORMAL HIGH (ref 0–35)
AST: 28 U/L (ref 0–37)
Alkaline Phosphatase: 107 U/L (ref 39–117)
BUN: 4 mg/dL — AB (ref 6–23)
CALCIUM: 9.2 mg/dL (ref 8.4–10.5)
CO2: 23 mEq/L (ref 19–32)
Chloride: 101 mEq/L (ref 96–112)
Creatinine, Ser: 0.61 mg/dL (ref 0.50–1.10)
GFR calc non Af Amer: >90 mL/min (ref >90)
GLUCOSE: 97 mg/dL (ref 70–99)
Potassium: 3.6 mEq/L — ABNORMAL LOW (ref 3.7–5.3)
SODIUM: 137 meq/L (ref 137–147)
TOTAL PROTEIN: 6.8 g/dL (ref 6.0–8.3)
Total Bilirubin: 0.5 mg/dL (ref 0.3–1.2)

## 2013-06-01 LAB — CBC
HEMATOCRIT: 34.8 % — AB (ref 36.0–46.0)
HEMOGLOBIN: 11.5 g/dL — AB (ref 12.0–15.0)
MCH: 31.1 pg (ref 26.0–34.0)
MCHC: 33 g/dL (ref 30.0–36.0)
MCV: 94.1 fL (ref 78.0–100.0)
Platelets: 300 10*3/uL (ref 150–400)
RBC: 3.7 MIL/uL — ABNORMAL LOW (ref 3.87–5.11)
RDW: 14.4 % (ref 11.5–15.5)
WBC: 10.9 10*3/uL — ABNORMAL HIGH (ref 4.0–10.5)

## 2013-06-01 LAB — URIC ACID: URIC ACID, SERUM: 3 mg/dL (ref 2.4–7.0)

## 2013-06-01 MED ORDER — ZOLPIDEM TARTRATE 5 MG PO TABS: 5.0000 mg | ORAL_TABLET | Freq: Every evening | ORAL | Status: DC | PRN

## 2013-06-01 MED ORDER — DOCUSATE SODIUM 100 MG PO CAPS
100.0000 mg | ORAL_CAPSULE | Freq: Every day | ORAL | Status: DC
  Administered 2013-06-04 – 2013-07-05 (×27): 100 mg via ORAL
  Filled 2013-06-01 (×31): qty 1

## 2013-06-01 MED ORDER — ACETAMINOPHEN 325 MG PO TABS
650.0000 mg | ORAL_TABLET | ORAL | Status: DC | PRN
  Administered 2013-06-02 – 2013-06-17 (×6): 650 mg via ORAL
  Filled 2013-06-01 (×7): qty 2

## 2013-06-01 MED ORDER — PRENATAL MULTIVITAMIN CH
1.0000 | ORAL_TABLET | Freq: Every day | ORAL | Status: DC
  Administered 2013-06-02 – 2013-07-04 (×31): 1 via ORAL
  Filled 2013-06-01 (×12): qty 1

## 2013-06-01 MED ORDER — LABETALOL HCL 200 MG PO TABS
200.0000 mg | ORAL_TABLET | Freq: Two times a day (BID) | ORAL | Status: DC
  Administered 2013-06-01 – 2013-06-03 (×4): 200 mg via ORAL
  Filled 2013-06-01 (×4): qty 1

## 2013-06-01 MED ORDER — CALCIUM CARBONATE ANTACID 500 MG PO CHEW
2.0000 | CHEWABLE_TABLET | ORAL | Status: DC | PRN
  Administered 2013-07-01 – 2013-07-03 (×6): 400 mg via ORAL
  Filled 2013-06-01 (×7): qty 1

## 2013-06-01 NOTE — MAU Provider Note (Signed)
Chief Complaint:  Hypertension    HPI: Bridget Fields is a 43 y.o. [id number] at [redacted]w[redacted]d who was sent from office after prenatal visit with Dr. Philis Pique today. She has been followed for chronic hypertension with suspected superimposed gestational hypertension. Denies H/A, visual disturbance, epigastric pain, contractions, leakage of fluid or vaginal bleeding. Good fetal movement. She denies nausea/vomiting or food intolerances. Taking labetalol as directed.   Pregnancy Course: Complicated by ZYS>06, obesity, CHTN not well controlled, borderline elevated AST, elevated AFI. Hospitalized 05/18/13 and received BMZ, started on labetolol. 24 hr urine protein was 226mg . Per MFM recommendation she was to have stayed here until delivery, but after 5 days she left AMA. However now she does agree to stay.  Of note she had 50 gram glucola in the office and serum glucose collected here was 1hr 72min after the glucose load.  Past Medical History: Past Medical History  Diagnosis Date  . Hypertension   . Miscarriage   . Abnormal Pap smear 1992 2014    1992 [place], 02/2012 ASCUS HPV neg  . HPV in female     Past obstetric history: OB History  Gravida Para Term Preterm AB SAB TAB Ectopic Multiple Living  3 1 0 1 1 1    1     # Outcome Date GA Lbr Len/2nd Weight Sex Delivery Anes PTL Lv  3 CUR           2 PRE 03/05/90    F SVD   Y  1 SAB               Past Surgical History: Past Surgical History  Procedure Laterality Date  . Knee arthrocentesis  plate and screws  . Cervical biopsy  w/ loop electrode excision       Family History: Family History  Problem Relation Age of Onset  . Hypertension Mother   . Diabetes Mother   . Breast cancer Mother 75  . Heart disease Father   . Hypertension Maternal Grandmother   . Hypertension Maternal Grandfather     Social History: History  Substance Use Topics  . Smoking status: Former Research scientist (life sciences)  . Smokeless tobacco: Not on file  . Alcohol Use: No    Allergies:   Allergies  Allergen Reactions  . Aspirin Hives    Meds:  Prescriptions prior to admission  Medication Sig Dispense Refill  . labetalol (NORMODYNE) 200 MG tablet Take 1 tablet (200 mg total) by mouth 2 (two) times daily.  90 tablet  3  . Prenatal Vit-Fe Fumarate-FA (PRENATAL MULTIVITAMIN) TABS tablet Take 1 tablet by mouth daily at 12 noon.        ROS: Pertinent findings in history of present illness.  Physical Exam  Last menstrual period 11/23/2012. GENERAL: Well-developed, well-nourished female in no acute distress.  HEENT: normocephalic HEART: normal rate RESP: normal effort ABDOMEN: Soft, non-tender, gravid appropriate for gestational age EXTREMITIES: Nontender, no edema NEURO: alert and oriented  FHT:  Baseline 145-150 , moderate variability, 10 bpm accelerations present, no decelerations Contractions: none   Labs: Results for orders placed during the hospital encounter of 06/01/13 (from the past 24 hour(s))  CBC     Status: Abnormal   Collection Time    06/01/13  3:04 PM      Result Value Ref Range   WBC 10.9 (*) 4.0 - 10.5 K/uL   RBC 3.70 (*) 3.87 - 5.11 MIL/uL   Hemoglobin 11.5 (*) 12.0 - 15.0 g/dL   HCT 34.8 (*) 36.0 - 46.0 %  MCV 94.1  78.0 - 100.0 fL   MCH 31.1  26.0 - 34.0 pg   MCHC 33.0  30.0 - 36.0 g/dL   RDW 14.4  11.5 - 15.5 %   Platelets 300  150 - 400 K/uL  COMPREHENSIVE METABOLIC PANEL     Status: Abnormal   Collection Time    06/01/13  3:04 PM      Result Value Ref Range   Sodium 137  137 - 147 mEq/L   Potassium 3.6 (*) 3.7 - 5.3 mEq/L   Chloride 101  96 - 112 mEq/L   CO2 23  19 - 32 mEq/L   Glucose, Bld 97  70 - 99 mg/dL   BUN 4 (*) 6 - 23 mg/dL   Creatinine, Ser 0.61  0.50 - 1.10 mg/dL   Calcium 9.2  8.4 - 10.5 mg/dL   Total Protein 6.8  6.0 - 8.3 g/dL   Albumin 2.5 (*) 3.5 - 5.2 g/dL   AST 28  0 - 37 U/L   ALT 41 (*) 0 - 35 U/L   Alkaline Phosphatase 107  39 - 117 U/L   Total Bilirubin 0.5  0.3 - 1.2 mg/dL   GFR calc non Af Amer >90   >90 mL/min   GFR calc Af Amer >90  >90 mL/min  URIC ACID     Status: None   Collection Time    06/01/13  3:04 PM      Result Value Ref Range   Uric Acid, Serum 3.0  2.4 - 7.0 mg/dL  PROTEIN / CREATININE RATIO, URINE     Status: None   Collection Time    06/01/13  3:06 PM      Result Value Ref Range   Creatinine, Urine 52.33     Total Protein, Urine 5.7     PROTEIN CREATININE RATIO 0.11  0.00 - 0.15    Imaging:  US Ob Detail + 14 Wk  05/17/2013   OBSTETRICAL ULTRASOUND: This exam was performed within a [facility]. The OB US report was generated in the [facility] system, and faxed to the ordering physician.   This report is also available in [employer] and in the [employer]. See [facility] Obstetric US report.  MAU Course:  Serial BPs Filed Vitals:   06/01/13 1549 06/01/13 1604 06/01/13 1619 06/01/13 1634  BP: 155/93 162/89 159/85 159/84  Pulse: 78 83 81 76    Assessment: 1. Gestational hypertension w/o significant proteinuria in 3rd trimester   43 yo G3P0111  Plan: C/W Dr. Ouida Sills admit to Antenatal  Lorene Dy, CNM 06/01/2013 3:36 PM

## 2013-06-01 NOTE — MAU Note (Signed)
Pt was sent from the office for evaluation of elevated BP

## 2013-06-02 NOTE — Progress Notes (Addendum)
Patient ID: Bridget Fields, female   DOB: 11/08/1970, 43 y.o.   MRN: [id number]  S: Pt feels well. States she plans to spend the duration of her pregnancy at the hospital as was previously recommended by MFM. "I'm not going to fight it any more". Reports active FM, No VB, No LOF. Denies headache, vision changes, RUQ/epig pain. O:  Filed Vitals:   06/02/13 1614 06/02/13 1953 06/02/13 2128 06/02/13 2219  BP: 163/96 162/90 158/90 153/83  Pulse: 84 84 88 86  Temp: 98.1 F (36.7 C) 98.4 F (36.9 C)    TempSrc: Oral Oral    Resp: 18 20    Height:      Weight:       AOX3 Abd soft, NT FHT 150 reactive by 10 x 10 criteria for 27 weeks Cvx deferred toco quiet  CBC    Component Value Date/Time   WBC 10.9* 06/01/2013 1504   RBC 3.70* 06/01/2013 1504   HGB 11.5* 06/01/2013 1504   HCT 34.8* 06/01/2013 1504   PLT 300 06/01/2013 1504   MCV 94.1 06/01/2013 1504   MCH 31.1 06/01/2013 1504   MCHC 33.0 06/01/2013 1504   RDW 14.4 06/01/2013 1504    CMP     Component Value Date/Time   NA 137 06/01/2013 1504   K 3.6* 06/01/2013 1504   CL 101 06/01/2013 1504   CO2 23 06/01/2013 1504   GLUCOSE 97 06/01/2013 1504   BUN 4* 06/01/2013 1504   CREATININE 0.61 06/01/2013 1504   CREATININE 0.56 05/17/2013 2025   CALCIUM 9.2 06/01/2013 1504   PROT 6.8 06/01/2013 1504   ALBUMIN 2.5* 06/01/2013 1504   AST 28 06/01/2013 1504   ALT 41* 06/01/2013 1504   ALKPHOS 107 06/01/2013 1504   BILITOT 0.5 06/01/2013 1504   GFRNONAA >90 06/01/2013 1504   GFRAA >90 06/01/2013 1504    A/P: 43 yo [id number] @ 27+3 with severe gestational hypertension.  Will follow recommendations from MFM on patient's prior admission  1. Continued admission to the hospital until delivery  2. Twice weekly preeclampsia labs  3. Daily EFM  4. Serial physical exams/vital signs  5. Initiate weekly BPP/AFI at 28-30 weeks while in house with severe gestational HTN  6. Interval growth monthly  7. Delivery by 34 weeks if not indicated sooner 8. S/P BMZ 4/14 &  4/15. 1 hr gtt administered in office ant drawn at hospital 1 hr 30 minutes later. Result 97

## 2013-06-02 NOTE — H&P (Signed)
Pt is a 43 yr old black female, [id number], at 27 weeks who was seen in the office yesterday by Dr. Philis Pique. She had an elevated B/P and was sent to the er for evaluation.She was admitted one week ago to [facility] for HTN felt to be preeclampsia. The plan per MFM was for her to be hospitalized until delivery. She left AMA. After speaking to Dr. Philis Pique she elected to be readmitted.She has no headache or visual problems. Her labs remain unchanged. B/Ps 130-160/85-100. She is on Labetalol for chronic HTN. PE 146/84 HEENT-wnl ABD - gravid, non tender FHTs reactive. IMP/ IUP at 27 weeks with chronic HTN and superimposed preeclampsia. Plan/ Will continue to follow plan established on last admission.

## 2013-06-03 MED ORDER — ONDANSETRON HCL 4 MG PO TABS
4.0000 mg | ORAL_TABLET | Freq: Three times a day (TID) | ORAL | Status: DC | PRN
  Administered 2013-06-03 – 2013-07-04 (×26): 4 mg via ORAL
  Filled 2013-06-03 (×26): qty 1

## 2013-06-03 MED ORDER — LABETALOL HCL 200 MG PO TABS
200.0000 mg | ORAL_TABLET | Freq: Three times a day (TID) | ORAL | Status: DC
  Administered 2013-06-03 – 2013-06-20 (×51): 200 mg via ORAL
  Filled 2013-06-03 (×52): qty 1

## 2013-06-03 NOTE — Progress Notes (Signed)
Pt without c/o. She had labs that were unchanged from earlier admission. She has normal FM.  Plan/ Will change Labetelol to 200 mg TID from BID.           Continue follow plan as listed.

## 2013-06-04 NOTE — Progress Notes (Signed)
Pt up to void and taken off the monitor after reassurring FHR

## 2013-06-05 NOTE — Progress Notes (Signed)
Pt off the monitor after reassurring FHR  

## 2013-06-06 NOTE — Progress Notes (Signed)
Patient ID: Bridget Fields, female   DOB: [birth year], 43 y.o.   MRN: [id number] Name: Bridget Fields Medical Record Number:  [id number] Date of Birth: 02/08/1970 Date of Service: 06/06/2013  Bridget Fields  is a 43 y.o. [id number], with an EDD of 08/29/2013, 28 weeks today HD #4 for uncontrolled hypertension as outpatient.  Patient was previously diagnosed with Chronic HTN with superimposed PIH.  There was a question as to mild pre-eclampsia As patient has had an abnormal ALT since early in her pregnancy.  Patient denies any blurry vision, RUQ pain, no headaches     Physical Examination: General appearance - alert, well appearing, and in no distress and oriented to person, place, and time Mental status - alert, oriented to person, place, and time, normal mood, behavior, speech, dress, motor activity, and thought processes Abdomen - soft, nontender, nondistended, no masses or organomegaly EFM: moderate variability with a baseline of 145 reactive for gestational age.  Toco: none  The patient's past medical history and prenatal records were reviewed.  Additional issues addressed and updated today: Patient Active Problem List   Diagnosis Date Noted  . Gestational hypertension 06/01/2013  . PIH (pregnancy induced hypertension) 05/18/2013   Family History  Problem Relation Age of Onset  . Hypertension Mother   . Diabetes Mother   . Breast cancer Mother 23  . Heart disease Father   . Hypertension Maternal Grandmother   . Hypertension Maternal Grandfather    History   Social History  . Marital Status: Married    Spouse Name: N/A    Number of Children: N/A  . Years of Education: N/A   Social History Main Topics  . Smoking status: Former Research scientist (life sciences)  . Smokeless tobacco: None  . Alcohol Use: No  . Drug Use: No  . Sexual Activity: Yes    Birth Control/ Protection: None   Other Topics Concern  . None   Social History Narrative  . None    A/P: 43 yo G3P0111 with h/o pre-eclampsia prior  pregnancies now with severe range BP as an outpatient  She is presently on Labetalol  200mg  TID. Her BP are improved.  We discussed as per MFM she will stay hospitalized until delivery. Goal is 34 weeks.  Patient voiced understanding.  She is steroid complete for [facility]  [facility]

## 2013-06-06 NOTE — Progress Notes (Signed)
Ur chart review completed.  

## 2013-06-07 ENCOUNTER — Inpatient Hospital Stay (HOSPITAL_COMMUNITY): Payer: Commercial Managed Care - PPO

## 2013-06-07 LAB — TYPE AND SCREEN
ABO/RH(D): O POS
ANTIBODY SCREEN: NEGATIVE

## 2013-06-07 NOTE — Progress Notes (Signed)
Patient ID: Drea Jurewicz, female   DOB: Dec 25, 1970, 43 y.o.   MRN: [id number] Patient ID: Sheneka Schrom, female DOB: 14-Aug-1970, 43 y.o. MRN: [id number]  Name: Cloee Dunwoody  Medical Record Number: [id number]  Date of Birth: March 27, 1970  Date of Service: 06/06/2013  Rossy Virag is a 43 y.o. [id number], with an EDD of 08/29/2013, 28 weeks today HD #5 for uncontrolled hypertension as outpatient.  Patient was previously diagnosed with Chronic HTN with superimposed PIH. There was a question as to mild pre-eclampsia  As patient has had an abnormal ALT since early in her pregnancy.  Patient denies any blurry vision, RUQ pain, no headaches. Does note some increased stress and anxiety regarding current hospitalization and financial strain this will place on her family  Physical Examination:  General appearance -AOX3, NAD  Abdomen - soft, nontender, nondistended  EFM:  150 reassuring for 28 weeks Toco: none   Patient Active Problem List    Diagnosis  Date Noted   .  Gestational hypertension  06/01/2013   .  PIH (pregnancy induced hypertension)  05/18/2013    Family History   Problem  Relation  Age of Onset   .  Hypertension  Mother    .  Diabetes  Mother    .  Breast cancer  Mother  22   .  Heart disease  Father    .  Hypertension  Maternal Grandmother    .  Hypertension  Maternal Grandfather     History    Social History   .  Marital Status:  Married     Spouse Name:  N/A     Number of Children:  N/A   .  Years of Education:  N/A    Social History Main Topics   .  Smoking status:  Former Research scientist (life sciences)   .  Smokeless tobacco:  None   .  Alcohol Use:  No   .  Drug Use:  No   .  Sexual Activity:  Yes     Birth Control/ Protection:  None    Other Topics  Concern   .  None    Social History Narrative   .  None    A/P: 43 yo G3P0111 with h/o pre-eclampsia prior pregnancies now with severe range BP as an outpatient  She is presently on Labetalol 200mg  TID. Her BP are improved.  We discussed as per MFM  she will stay hospitalized until delivery. Goal is 34 weeks.  Patient voiced understanding.  She is steroid complete for [facility] Will set up contacts for financial resources Will be having F/U US at [place] soon

## 2013-06-07 NOTE — Consult Note (Signed)
MFM consult, Staff Note  Impressions: 1. CHTN 2. Superimposed gestational hypertension ? Discussion:  I reviewed hypertension and gestational hypertension as a cause of uteroplacental insufficiency, with increased risk of IUGR, oligohydramnios, and stillbirth. I told her that her hypertension with superimposed GHTN also places her at increased risk for preeclampsia, describing the triad of increased blood pressure, proteinuria, and abnormal edema. Lastly, hypertension (severe range) increases the risk of placental abruption, especially in the setting of superimposed preeclampsia.  I reviewed the essential tenets in the most recent guidelines for management of hypertension in pregnancy in accordance with the [place] of Obstetrics and Gynecology expert opinion. We talked about the medical treatment of hypertension in pregnancy. I outlined the different classes of medications, emphasizing that angiotensin enzyme inhibitors and angoitensin receptor blockers are contraindicated, and diuretics are relatively contraindicated. I told her that beta-blockers and calcium channel blockers are commonly used to treat hypertension in pregnancy, and that both are felt to be safe for use in pregnancy.  Her labetalol 200mg  po TID is currently adequate to control CHTN as evidenced by the vital signs.  I outlined the usual plan of management for hypertension in pregnancy. She should have her blood pressure carefully followed, and her medications adjusted to keep her BP in the target range of around 140-159/80-105 mm/Hg.  While she appears medically stable she has had a number of severe range blood pressures sporadically throughout this hospital stay along with borderline elevation of LFTs (noting that the slight elevation appears more chronic and likely unrelated to [facility]), leading to my recommendation for inpatient surveillance with serial physical examinations and labs drawn weekly or sooner if clinically  indicated.    Summary of Recommendations: 1. Consider inpatient management for severe GHTN versus uncontrolled CHTN with borderline transaminitis 2. Treat only severe range blood pressures with adjustment of antihypertensive as needed; ie, increase dose of antihypertensives should be reserved until persistent severe range BP are documented,  3. Weekly labs for preeclampsia along with weekly physical examination in your office during prenatal visits (recommend reassessing today) 4. Would assess this patient clinically as inpatient to detect evidence of either maternal or fetal deterioration, noting that expectant management would not be recommended if deterioration of either mom or fetus became apparent. 6. Weekly AFI beginning at 30-32 weeks 7. Interval growth ultrasounds every 3-4 weeks 8. would initiate twice weekly NST with weekly AFI in GHTN/preeclampsia at 30-32 weeks.  That being said, given state of current hospital admission, my practice and recommendation is for daily EFM tracing 9. fetal kick counts 10. Lastly, regardless of whether this mom and baby continue to remain stable, I would recommend delivery in the period of 34-37 (depending upon clinical state of the patient) weeks with consideration for Magnesium sulfate during induction/intrapartum/postpartum, noting this remains contingent upon a reassuring fetal testing and stable maternal condition without onset of severe features in the interim.  I spent in excess of 40 minutes reviewing and evaluating this patient's case with greater than 50% of this time in face to face discussion. Discussed with Dr. Vanessa Kick.  Page with questions.  Eulis Foster, MD, MS, FACOG Assistant Professor, Maternal-Fetal Medicine

## 2013-06-08 LAB — CBC
HCT: 32.8 % — ABNORMAL LOW (ref 36.0–46.0)
Hemoglobin: 11 g/dL — ABNORMAL LOW (ref 12.0–15.0)
MCH: 31 pg (ref 26.0–34.0)
MCHC: 33.5 g/dL (ref 30.0–36.0)
MCV: 92.4 fL (ref 78.0–100.0)
PLATELETS: 302 10*3/uL (ref 150–400)
RBC: 3.55 MIL/uL — ABNORMAL LOW (ref 3.87–5.11)
RDW: 14.4 % (ref 11.5–15.5)
WBC: 11.1 10*3/uL — AB (ref 4.0–10.5)

## 2013-06-08 LAB — COMPREHENSIVE METABOLIC PANEL
ALBUMIN: 2.4 g/dL — AB (ref 3.5–5.2)
ALT: 46 U/L — ABNORMAL HIGH (ref 0–35)
AST: 29 U/L (ref 0–37)
Alkaline Phosphatase: 110 U/L (ref 39–117)
BUN: 4 mg/dL — ABNORMAL LOW (ref 6–23)
CALCIUM: 9.4 mg/dL (ref 8.4–10.5)
CO2: 24 mEq/L (ref 19–32)
Chloride: 102 mEq/L (ref 96–112)
Creatinine, Ser: 0.71 mg/dL (ref 0.50–1.10)
GFR calc Af Amer: >90 mL/min (ref >90)
GFR calc non Af Amer: >90 mL/min (ref >90)
Glucose, Bld: 116 mg/dL — ABNORMAL HIGH (ref 70–99)
Potassium: 3.6 mEq/L — ABNORMAL LOW (ref 3.7–5.3)
SODIUM: 137 meq/L (ref 137–147)
Total Bilirubin: 0.4 mg/dL (ref 0.3–1.2)
Total Protein: 6.4 g/dL (ref 6.0–8.3)

## 2013-06-08 NOTE — Progress Notes (Signed)
Antenatal Nutrition Assessment:  Currently  28 1/[redacted] weeks gestation, with HTN. Height  63 "  Weight 234 on 4/29 lbs  pre-pregnancy weight 210 lbs .  Pre-pregnancy  BMI 29.7  IBW 115 lbs Total weight gain 24.lbs Weight gain goals 15-25 lbs Estimated needs: 19-2100 kcal/day, 69-79 grams protein/day, 2.3 liters fluid/day  Regular diet tolerated well, appetite good. Snack menu provided Current diet prescription will provide for increased needs.  No abnormal nutrition related labs  Nutrition Dx: Increased nutrient needs r/t pregnancy and fetal growth requirements aeb [redacted] weeks gestation.  No educational needs assessed at this time.  Weyman Rodney M.Fredderick Severance LDN Neonatal Nutrition Support Specialist Pager [phone]

## 2013-06-08 NOTE — Progress Notes (Signed)
HD#6 uncontrolled CHTN with suspected superimposed GHTN remote from term.  Pt denies HA/vision change RUQ pain.  No other complaints.  Good FM.  Feels comfortable with hospitalization now and understands risks.  Filed Vitals:   06/07/13 2100 06/07/13 2132 06/07/13 2212 06/08/13 0818  BP:    143/86  Pulse:    92  Temp:    98.1 F (36.7 C)  TempSrc:    Oral  Resp: 18 18 18 18   Height:      Weight:        Fundus firm Perineum without swelling.  Lab Results  Component Value Date   WBC 11.1* 06/08/2013   HGB 11.0* 06/08/2013   HCT 32.8* 06/08/2013   MCV 92.4 06/08/2013   PLT 302 06/08/2013    --/--/O POS (05/05 1220)  A/P HD#6 CHTN, superimposed GHTN Continue labetalol 200tid - well controlled. Due for weekly labs today. 24hr urine ordered.  CBC and CMP essentially normal, slight elevation in ALT, stable from prior. Daily EFM - reassuring. Continue other current mgmt. See MFM consult 06/07/13, inpatient mgmt until delivery 34-37 weeks.  Bridget Fields

## 2013-06-08 NOTE — Progress Notes (Signed)
CSW acknowledges CSW consult for financial concerns.  CSW contacted Cleot[facility] [place]/financial counselor to pass on the referral for her.  She states she will contact patient.  CSW met with patient briefly to inform her of this and provide her with the phone number for [facility] per her request.  Patient was appreciative and states no questions, concerns or needs for CSW.

## 2013-06-09 LAB — CREATININE CLEARANCE, URINE, 24 HOUR
COLLECTION INTERVAL-CRCL: 24 h
Creatinine Clearance: 180 mL/min — ABNORMAL HIGH (ref 75–115)
Creatinine, 24H Ur: 1836 mg/d — ABNORMAL HIGH (ref 700–1800)
Creatinine, Urine: 104.89 mg/dL
Creatinine: 0.71 mg/dL (ref 0.50–1.10)
URINE TOTAL VOLUME-CRCL: 1750 mL

## 2013-06-09 LAB — PROTEIN, URINE, 24 HOUR
COLLECTION INTERVAL-UPROT: 24 h
PROTEIN 24H UR: 158 mg/d — AB (ref 50–100)
PROTEIN, URINE: 9 mg/dL
Urine Total Volume-UPROT: 1750 mL

## 2013-06-09 NOTE — Progress Notes (Signed)
Patient ID: Bridget Fields, female   DOB: 1970/09/16, 43 y.o.   MRN: [id number] 43 y.o. [id number] [redacted]w[redacted]d HD#8 admitted for 28 WKS, HBP (chronic HTN, with superimposed severe PIH)  Pt currently stable with no c/o of HA, blurry vision, or RUQ pain.  She denies CTX, no VB, no LOF. Good FM.   Patient Vitals for the past 24 hrs:  BP Temp Temp src Pulse Resp  06/09/13 0817 145/74 mmHg 98 F (36.7 C) Oral 86 20  06/08/13 2210 - - - - 18  06/08/13 2128 - - - - 18  06/08/13 2100 - - - - 18  06/08/13 1948 144/65 mmHg 98 F (36.7 C) Oral 93 18  06/08/13 1604 - - - 91 -  06/08/13 1603 159/75 mmHg 98.1 F (36.7 C) Oral - 18    Abd  Soft, gravid, nontender Ex SCDs FHTs  145s, moderate variability accels no decels NST Cat I Toco  none  Results for orders placed during the hospital encounter of 06/01/13 (from the past 24 hour(s))  CBC     Status: Abnormal   Collection Time    06/08/13 10:19 AM      Result Value Ref Range   WBC 11.1 (*) 4.0 - 10.5 K/uL   RBC 3.55 (*) 3.87 - 5.11 MIL/uL   Hemoglobin 11.0 (*) 12.0 - 15.0 g/dL   HCT 32.8 (*) 36.0 - 46.0 %   MCV 92.4  78.0 - 100.0 fL   MCH 31.0  26.0 - 34.0 pg   MCHC 33.5  30.0 - 36.0 g/dL   RDW 14.4  11.5 - 15.5 %   Platelets 302  150 - 400 K/uL  COMPREHENSIVE METABOLIC PANEL     Status: Abnormal   Collection Time    06/08/13 10:19 AM      Result Value Ref Range   Sodium 137  137 - 147 mEq/L   Potassium 3.6 (*) 3.7 - 5.3 mEq/L   Chloride 102  96 - 112 mEq/L   CO2 24  19 - 32 mEq/L   Glucose, Bld 116 (*) 70 - 99 mg/dL   BUN 4 (*) 6 - 23 mg/dL   Creatinine, Ser 0.71  0.50 - 1.10 mg/dL   Calcium 9.4  8.4 - 10.5 mg/dL   Total Protein 6.4  6.0 - 8.3 g/dL   Albumin 2.4 (*) 3.5 - 5.2 g/dL   AST 29  0 - 37 U/L   ALT 46 (*) 0 - 35 U/L   Alkaline Phosphatase 110  39 - 117 U/L   Total Bilirubin 0.4  0.3 - 1.2 mg/dL   GFR calc non Af Amer >90  >90 mL/min   GFR calc Af Amer >90  >90 mL/min    A:  HD#8  [redacted]w[redacted]d with CHTN , Severe PIH. Presently she  is on labetalol 200mg  TID BP improved no s/sx of pre-e. Transaminitis slightly worsened ALT 46  AST still within normal limits  P: Continue NST q shift Continue current dose of labetalol Per MFM, starting at 30 weeks will monitor AFI Growth scans q3-4 weeks Delivery between 34-37 weeks, sooner if e/o severe pre-e Patient collecting 24 hr urine  Sanjuana Kava

## 2013-06-09 NOTE — Progress Notes (Signed)
Pt off the monitor after reassurring FHR  

## 2013-06-09 NOTE — Progress Notes (Signed)
Pt off the monitor after reassuring FHR

## 2013-06-10 MED ORDER — FAMOTIDINE 20 MG PO TABS
20.0000 mg | ORAL_TABLET | Freq: Two times a day (BID) | ORAL | Status: DC
  Administered 2013-06-10 – 2013-07-02 (×42): 20 mg via ORAL
  Filled 2013-06-10 (×44): qty 1

## 2013-06-10 NOTE — Progress Notes (Addendum)
43 y.o. [id number] 10w4dHD#9 admitted for 28 WKS, HBP.  Pt currently stable with no c/o Pre-e sx- only occ HA.  Good FM. Filed Vitals:   06/09/13 1210 06/09/13 1627 06/09/13 2019 06/10/13 0628  BP: 157/96 153/87 142/55 148/78  Pulse: 88 89 96 87  Temp: 98 F (36.7 C) 98.2 F (36.8 C) 98 F (36.7 C)   TempSrc: Oral Oral Oral   Resp: _0 Height:      Weight:        Lungs CTA Cor RRR Abd  Soft, gravid, nontender Ex SCDs FHTs  Last night 150s, good short term variability, NST R with occ variable, no severe.  Toco  None.  Results for orders placed during the hospital encounter of 06/01/13 (from the past 72 hour(s))  TYPE AND SCREEN     Status: None   Collection Time    06/07/13 12:20 PM      Result Value Ref Range   ABO/RH(D) O POS     Antibody Screen NEG     Sample Expiration 06/10/2013    CBC     Status: Abnormal   Collection Time    06/08/13 10:19 AM      Result Value Ref Range   WBC 11.1 (*) 4.0 - 10.5 K/uL   RBC 3.55 (*) 3.87 - 5.11 MIL/uL   Hemoglobin 11.0 (*) 12.0 - 15.0 g/dL   HCT 32.8 (*) 36.0 - 46.0 %   MCV 92.4  78.0 - 100.0 fL   MCH 31.0  26.0 - 34.0 pg   MCHC 33.5  30.0 - 36.0 g/dL   RDW 14.4  11.5 - 15.5 %   Platelets 302  150 - 400 K/uL  COMPREHENSIVE METABOLIC PANEL     Status: Abnormal   Collection Time    06/08/13 10:19 AM      Result Value Ref Range   Sodium 137  137 - 147 mEq/L   Potassium 3.6 (*) 3.7 - 5.3 mEq/L   Chloride 102  96 - 112 mEq/L   CO2 24  19 - 32 mEq/L   Glucose, Bld 116 (*) 70 - 99 mg/dL   BUN 4 (*) 6 - 23 mg/dL   Creatinine, Ser 0.71  0.50 - 1.10 mg/dL   Calcium 9.4  8.4 - 10.5 mg/dL   Total Protein 6.4  6.0 - 8.3 g/dL   Albumin 2.4 (*) 3.5 - 5.2 g/dL   AST 29  0 - 37 U/L   ALT 46 (*) 0 - 35 U/L   Alkaline Phosphatase 110  39 - 117 U/L   Total Bilirubin 0.4  0.3 - 1.2 mg/dL   GFR calc non Af Amer >90  >90 mL/min   GFR calc Af Amer >90  >90 mL/min   Comment: (NOTE)     The eGFR has been calculated using the CKD EPI  equation.     This calculation has not been validated in all clinical situations.     eGFR's persistently <90 mL/min signify possible Chronic Kidney     Disease.  PROTEIN, URINE, 24 HOUR     Status: Abnormal   Collection Time    06/08/13  2:00 PM      Result Value Ref Range   Urine Total Volume-UPROT 1750     Collection Interval-UPROT 24     Protein, Urine 9     Protein, 24H Urine 158 (*) 50 - 100 mg/day   Comment: Performed at [facility]  CREATININE CLEARANCE, URINE, 24 HOUR     Status: Abnormal   Collection Time    06/08/13  2:00 PM      Result Value Ref Range   Urine Total Volume-CRCL 1750     Collection Interval-CRCL 24     Creatinine, Urine 104.89     Creatinine 0.71  0.50 - 1.10 mg/dL   Creatinine, 24H Ur 1836 (*) 700 - 1800 mg/day   Creatinine Clearance 180 (*) 75 - 115 mL/min   Comment: Performed at [facility]    A:  HD#9  33w4dwith PIH- currently stable BPs on bedrest in hospital and current dose of labetalol.  Pt's BPs before admission were in the stroke range and she was at severe risk of complications.  Pt also is at risk for abruption and developing severe preeclampsia and therefore warrants in patient admission.  Mother and baby are at too high of a risk as outpatient.  Presently she is on labetalol 2046mTID BP improved no s/sx of pre-e.  LFTs slightly worsened ALT 46 on 5-6; AST still within normal limits. P:  Continue NST q shift. Continue current dose of labetalol.  Per MFM, starting at 30 weeks will monitor AFI.  Growth scans q3-4 weeks-- last done 5-5 with EFW 1077, 36%ile and VTX.   Delivery between 34-37 weeks, sooner if e/o severe pre-e  24 hr urine done yesterday 5-8--now only 158 mg protein per 24 hours- less than previously, likely that kidney function is better in patient bed rest. Pepcid PO for heartburn and am nausea.  Pt may go on hospital tour and do some classes ONLY if BPs are stable during wheel chair excursions.  Pt asking  about exercise- PT for in bed movements.    MiDaria Fields

## 2013-06-10 NOTE — Evaluation (Signed)
Physical Therapy Evaluation Patient Details Name: Bridget Fields MRN: [id number] DOB: 1970-05-25 Today's Date: 06/10/2013   History of Present Illness  [id number] admitted at 41 WKS for high BP.  currently on bedrest with BRP  Clinical Impression  Patient demonstrated understanding of all exercises and limitations.  Patient very motivated to perform exercises as instructed.  Issued Antenatal on Bedrest handout and reviewed with patient.  Will monitor via chart review for changes in status.      Follow Up Recommendations No PT follow up    Equipment Recommendations  None recommended by PT    Recommendations for Other Services       Precautions / Restrictions Precautions Precaution Comments: bedrest with BRP, wheelchair rides      Mobility  Bed Mobility Overal bed mobility: Independent                Transfers                    Ambulation/Gait                Stairs            Wheelchair Mobility    Modified Rankin (Stroke Patients Only)       Balance                                             Pertinent Vitals/Pain Denies pain    Home Living Family/patient expects to be discharged to:: Private residence Living Arrangements: Spouse/significant other                    Prior Function                 Hand Dominance        Extremity/Trunk Assessment   Upper Extremity Assessment: Overall WFL for tasks assessed           Lower Extremity Assessment: Overall WFL for tasks assessed         Communication      Cognition Arousal/Alertness: Awake/alert Behavior During Therapy: WFL for tasks assessed/performed Overall Cognitive Status: Within Functional Limits for tasks assessed                      General Comments      Exercises Antenatal Exercises Ankle Circles/Pumps: AROM;Both;10 reps;Supine Quad Sets: AROM;Both;10 reps;Supine Short Arc Quad: AROM;10 reps;Both;Supine Hip  ABduction/ADduction: AROM;Both;10 reps;Supine Sidelying Hip Flexor Stretch: AAROM;Both      Assessment/Plan    PT Assessment Patent does not need any further PT services  PT Diagnosis     PT Problem List    PT Treatment Interventions     PT Goals (Current goals can be found in the Care Plan section) Acute Rehab PT Goals PT Goal Formulation: No goals set, d/c therapy    Frequency     Barriers to discharge        Co-evaluation               End of Session   Activity Tolerance: Patient tolerated treatment well Patient left: in bed;with call bell/phone within reach           Time: 4098-1191 PT Time Calculation (min): 17 min   Charges:   PT Evaluation $Initial PT Evaluation Tier I: 1 Procedure     PT G Codes:  Bridget Fields [phone] 06/10/2013, 3:35 PM

## 2013-06-10 NOTE — Progress Notes (Signed)
Talked with patient regarding refusal of type and screen this am. Informed of PPH protocol. Patient states that the lab stuck her once this am and missed. Patients states that she will talk with md in am.

## 2013-06-10 NOTE — Progress Notes (Signed)
Pt refused type and screen blood draw.

## 2013-06-11 LAB — TYPE AND SCREEN
ABO/RH(D): O POS
Antibody Screen: NEGATIVE

## 2013-06-11 NOTE — Progress Notes (Signed)
43 y.o. [id number] [redacted]w[redacted]d HD#10 admitted for 28 WKS, HBP.  Pt currently stable with no c/o severe sx.  Good FM.  Filed Vitals:   06/10/13 2245 06/11/13 0554 06/11/13 0840 06/11/13 0841  BP: 147/81 151/96  132/55  Pulse: 91 91  89  Temp:  98 F (36.7 C) 98.1 F (36.7 C)   TempSrc:  Oral Oral   Resp: 20 20 20    Height:      Weight:        Lungs CTA Cor RRR Abd  Soft, gravid, nontender Ex SCDs FHTs  140s, good short term variability, NST R; occ variables Toco  occ  Results for orders placed during the hospital encounter of 06/01/13 (from the past 24 hour(s))  TYPE AND SCREEN     Status: None   Collection Time    06/11/13  8:13 AM      Result Value Ref Range   ABO/RH(D) O POS     Antibody Screen NEG     Sample Expiration 06/14/2013      A:  HD#10  [redacted]w[redacted]d with PIH- severe BPs are responding to bed rest and treatment.  Pt's BPs before admission were in the stroke range and she was at severe risk of complications. Pt also is at risk for abruption and developing severe preeclampsia and therefore warrants in patient admission. Mother and baby are at too high of a risk as outpatient.   P: Continue NST q shift.  Continue current dose of labetalol.  Per MFM, starting at 30 weeks will monitor AFI.  Growth scans q3-4 weeks-- last done 5-5 with EFW 1077, 36%ile and VTX.  Delivery between 34-37 weeks, sooner if e/o severe pre-e  24 hr urine done 5-8--now only 158 mg protein per 24 hours- less than previously, likely that kidney function is better in patient bed rest.  Pepcid PO for heartburn and am nausea. Will recheck CMET for LFTs on Monday unless severe sx in meantime.  [name]

## 2013-06-12 NOTE — Progress Notes (Signed)
43 y.o. [id number] [redacted]w[redacted]d HD#11 admitted for 28 WKS, HBP, PIH  Pt currently stable with no c/o severe sx.  Good FM.  Filed Vitals:   06/11/13 1749 06/11/13 2019 06/12/13 0610 06/12/13 0857  BP: 152/87 156/78 142/81 135/72  Pulse: 88 91 89 96  Temp: 98.1 F (36.7 C) 98.2 F (36.8 C) 98 F (36.7 C) 98.2 F (36.8 C)  TempSrc: Oral Oral Oral Oral  Resp: 18 20 18 18   Height:      Weight:         Lungs CTA Cor RRR Abd  Soft, gravid, nontender Ex SCDs FHTs  130s, good short term variability, NST R Toco  occ  No results found for this or any previous visit (from the past 24 hour(s)).  A:  HD#11  [redacted]w[redacted]d with PIH. Severe BPs are responding to bed rest and treatment. Pt's BPs before admission were in the stroke range and she was at severe risk of complications. Pt also is at risk for abruption and developing severe preeclampsia and therefore warrants in patient admission. Mother and baby are at too high of a risk as outpatient.  P:  Continue NST q shift.  Continue current dose of labetalol.  Per MFM, starting at 30 weeks will monitor AFI.  Growth scans q3-4 weeks-- last done 5-5 with EFW 1077, 36%ile and VTX.  Delivery between 34-37 weeks, sooner if e/o severe pre-e  24 hr urine done 5-8--now only 158 mg protein per 24 hours- less than previously, likely that kidney function is better in patient bed rest.  Pepcid PO for heartburn and am nausea.  Will recheck CMET for LFTs tomorrow.   [name]

## 2013-06-12 NOTE — Progress Notes (Signed)
Pt. Going for a wheelchair ride with significant other at side.

## 2013-06-13 LAB — COMPREHENSIVE METABOLIC PANEL
ALK PHOS: 109 U/L (ref 39–117)
ALT: 41 U/L — ABNORMAL HIGH (ref 0–35)
AST: 30 U/L (ref 0–37)
Albumin: 2.4 g/dL — ABNORMAL LOW (ref 3.5–5.2)
BUN: 5 mg/dL — ABNORMAL LOW (ref 6–23)
CALCIUM: 9.4 mg/dL (ref 8.4–10.5)
CO2: 24 mEq/L (ref 19–32)
CREATININE: 0.63 mg/dL (ref 0.50–1.10)
Chloride: 101 mEq/L (ref 96–112)
GFR calc non Af Amer: >90 mL/min (ref >90)
GLUCOSE: 86 mg/dL (ref 70–99)
Potassium: 4 mEq/L (ref 3.7–5.3)
Sodium: 138 mEq/L (ref 137–147)
Total Bilirubin: 0.4 mg/dL (ref 0.3–1.2)
Total Protein: 6.2 g/dL (ref 6.0–8.3)

## 2013-06-13 NOTE — Progress Notes (Signed)
Pt off the monitor after reassurring FHR  

## 2013-06-13 NOTE — Progress Notes (Signed)
Bridget Fields is in good spirits today and is expecting a visit from her sister.  She was grateful for the visit and said that there are days that are harder than others.  I encouraged her to reach out to [place] for support when she is having a hard day.  [name] Pager, [phone] 11:24 AM   06/13/13 1100  Clinical Encounter Type  Visited With Patient  Visit Type Initial  Referral From Nurse

## 2013-06-13 NOTE — Progress Notes (Signed)
Ur chart review completed.  

## 2013-06-13 NOTE — Progress Notes (Signed)
Late entry Bridget Fields has no complaints but constipation, she thinks it is the hospital PNV and would like to take her own.  Good FM, no ctx, LOF, bleeding.  No HA/vision change or RUQ pain.  Patient is eating, ambulating, voiding.  Pain control is good.  Filed Vitals:   06/13/13 0817 06/13/13 0942 06/13/13 1201 06/13/13 1546  BP: 149/74 142/53 153/71 143/57  Pulse: 90 92 100 92  Temp: 98 F (36.7 C)  98.1 F (36.7 C) 97.7 F (36.5 C)  TempSrc: Oral  Oral Oral  Resp: 20 20 20 18   Height:      Weight:        Lab Results  Component Value Date   WBC 11.1* 06/08/2013   HGB 11.0* 06/08/2013   HCT 32.8* 06/08/2013   MCV 92.4 06/08/2013   PLT 302 06/08/2013    A/P 29.0 with CHTN and suspected superimposed severe GHTN per MFM Stable, cont. Labetalol 200bid Will have pharmacy review and dispense pt's own PNV. Continue other routine mgmt.   Allyn Kenner

## 2013-06-14 NOTE — Progress Notes (Signed)
Pt without complaints. No headache or vision changes. B/P wnl. NSTs ok

## 2013-06-15 NOTE — Progress Notes (Signed)
Name: Bridget Fields Medical Record Number:  [id number] Date of Birth: November 13, 1970 Date of Service: 06/15/2013  43 y.o. [id number] [redacted]w[redacted]d HD#14 admitted for 28 WKS, HBP.  Pt currently stable with no c/o. She denies HA, blurry vision or RUQ pain. She denies contractions, no vaginal bleeding, no leaking of fluid. Reports good FM.  The patient's past medical history and prenatal records were reviewed.  Additional issues addressed and updated today: Patient Active Problem List   Diagnosis Date Noted  . Gestational hypertension 06/01/2013  . PIH (pregnancy induced hypertension) 05/18/2013   Family History  Problem Relation Age of Onset  . Hypertension Mother   . Diabetes Mother   . Breast cancer Mother 54  . Heart disease Father   . Hypertension Maternal Grandmother   . Hypertension Maternal Grandfather    History   Social History  . Marital Status: Married    Spouse Name: N/A    Number of Children: N/A  . Years of Education: N/A   Social History Main Topics  . Smoking status: Former Research scientist (life sciences)  . Smokeless tobacco: None  . Alcohol Use: No  . Drug Use: No  . Sexual Activity: Yes    Birth Control/ Protection: None   Other Topics Concern  . None   Social History Narrative  . None   Filed Vitals:   06/15/13 1613  BP: 142/75  Pulse: 91  Temp: 98.1 F (36.7 C)  Resp: 18     Physical Examination: General appearance - alert, well appearing, and in no distress, oriented to person, place, and time and well hydrated BP reviewed SBP: 150's/ DBP 70-80's Lungs CTA Cor RRR Abd  Soft, gravid, nontender Ex SCDs FHTs  145s, moderate variability accels no decels Toco  none  Cervix: not evaluated  No results found for this or any previous visit (from the past 24 hour(s)).  A:  HD#14  [redacted]w[redacted]d with h/o severe pregnancy induced hypertension no s/sx of pre-eclampsia. Patient with elevated ALT that has remained stable for weeks.   P: Will change Labetalol from TID to Q8 hrs to ensure  better BP control. Ideal range is 135-140 SBP DBP 80-90 Will obtain weekly labs (next draw next week).    Rogelio Seen Analie Katzman

## 2013-06-16 NOTE — Progress Notes (Signed)
Rn explained to pt that FHR not reactive and she will need to be monitored longer, pt stating that she needs to void and that RN can chart that she has refused and that she will be back on the monitor tonight.  Discussed with pt that she can get up to void and return to monitor to continue following FHR.  Pt refuses again.  Pt does however state that she is feeling the baby move around.

## 2013-06-16 NOTE — Progress Notes (Signed)
MD reviewing strip, no orders at this time.  Pt instructed to tell RN if she noticed decreased FM and pt agrees to this.  Per MD if FHR tracing not acceptable this evening then may f/u with a BPP

## 2013-06-16 NOTE — Progress Notes (Addendum)
Name: Bridget Fields Medical Record Number:  [id number] Date of Birth: Dec 07, 1970 Date of Service: 06/16/2013  43 y.o. [id number] [redacted]w[redacted]d HD#15 admitted for 28 WKS, HBP.  Patient sleeping  The patient's past medical history and prenatal records were reviewed.  Additional issues addressed and updated today: Patient Active Problem List   Diagnosis Date Noted  . Gestational hypertension 06/01/2013  . PIH (pregnancy induced hypertension) 05/18/2013   Family History  Problem Relation Age of Onset  . Hypertension Mother   . Diabetes Mother   . Breast cancer Mother 5  . Heart disease Father   . Hypertension Maternal Grandmother   . Hypertension Maternal Grandfather    History   Social History  . Marital Status: Married    Spouse Name: N/A    Number of Children: N/A  . Years of Education: N/A   Social History Main Topics  . Smoking status: Former Research scientist (life sciences)  . Smokeless tobacco: None  . Alcohol Use: No  . Drug Use: No  . Sexual Activity: Yes    Birth Control/ Protection: None   Other Topics Concern  . None   Social History Narrative  . None   Filed Vitals:   06/16/13 0828  BP: 152/78  Pulse: 91  Temp: 98 F (36.7 C)  Resp: 18     Physical Examination: Patient currently sleeping, exam deferred  No results found for this or any previous visit (from the past 24 hour(s)).  A:  HD#15  [redacted]w[redacted]d with h/o severe pregnancy induced hypertension no s/sx of pre-eclampsia. Patient with elevated ALT that has remained stable for weeks.   P: 1) Continue current management with hospital bedrest 2) Labetalol 200mg  Q 8 hrs 3) SCDs for DVT prophylaxis 4) Twice daily NSTs  5)  Interval growth ultrasounds every 3-4 weeks, Last [place] 06/07/2013 6) Initiate weekly AFI in GHTN/preeclampsia at 30-32 weeks    Yerick Eggebrecht H. [employer]

## 2013-06-16 NOTE — Progress Notes (Signed)
Pt off the monitor after reassuring FHR

## 2013-06-16 NOTE — Progress Notes (Signed)
Monitors applied

## 2013-06-17 NOTE — Progress Notes (Signed)
This note also relates to the following rows which could not be included: Pulse Rate - Cannot attach notes to unvalidated device data   

## 2013-06-17 NOTE — Progress Notes (Signed)
HD#16 Stable BPs with one severe range overnight, on labetalol 200mg  tid No s/s of preeclampsia No other complaints Continue current mgmt

## 2013-06-18 ENCOUNTER — Inpatient Hospital Stay (HOSPITAL_COMMUNITY): Payer: Commercial Managed Care - PPO

## 2013-06-18 NOTE — Progress Notes (Signed)
HD #17 Sleeping comfortably on arrival.  Reports HA last night now resolved.  Good FM.  No complaints. Due for BPP and EFW on Monday with repeat labs. Continue current mgmt with labetalol 200 tid. Other routine antenatal care.

## 2013-06-19 NOTE — Progress Notes (Signed)
HD#18 No complaints. Mild range, overall stable BPs NST reactive Good FM Continue current mgmt

## 2013-06-19 NOTE — Progress Notes (Signed)
Pt off the monitor after reassuring FHR

## 2013-06-19 NOTE — Progress Notes (Signed)
Pt off the monitor after reassurring FHR  

## 2013-06-19 NOTE — Progress Notes (Signed)
Eating breakfast 

## 2013-06-20 ENCOUNTER — Inpatient Hospital Stay (HOSPITAL_COMMUNITY): Payer: Commercial Managed Care - PPO

## 2013-06-20 LAB — CBC
HCT: 36 % (ref 36.0–46.0)
Hemoglobin: 12 g/dL (ref 12.0–15.0)
MCH: 30.9 pg (ref 26.0–34.0)
MCHC: 33.3 g/dL (ref 30.0–36.0)
MCV: 92.8 fL (ref 78.0–100.0)
Platelets: 313 10*3/uL (ref 150–400)
RBC: 3.88 MIL/uL (ref 3.87–5.11)
RDW: 14.5 % (ref 11.5–15.5)
WBC: 14.8 10*3/uL — ABNORMAL HIGH (ref 4.0–10.5)

## 2013-06-20 LAB — COMPREHENSIVE METABOLIC PANEL
ALBUMIN: 2.5 g/dL — AB (ref 3.5–5.2)
ALK PHOS: 124 U/L — AB (ref 39–117)
ALT: 45 U/L — ABNORMAL HIGH (ref 0–35)
AST: 30 U/L (ref 0–37)
BUN: 7 mg/dL (ref 6–23)
CHLORIDE: 102 meq/L (ref 96–112)
CO2: 24 mEq/L (ref 19–32)
Calcium: 10 mg/dL (ref 8.4–10.5)
Creatinine, Ser: 0.7 mg/dL (ref 0.50–1.10)
GFR calc Af Amer: >90 mL/min (ref >90)
GFR calc non Af Amer: >90 mL/min (ref >90)
Glucose, Bld: 86 mg/dL (ref 70–99)
POTASSIUM: 4.5 meq/L (ref 3.7–5.3)
Sodium: 138 mEq/L (ref 137–147)
Total Bilirubin: 0.5 mg/dL (ref 0.3–1.2)
Total Protein: 6.5 g/dL (ref 6.0–8.3)

## 2013-06-20 LAB — TYPE AND SCREEN
ABO/RH(D): O POS
Antibody Screen: NEGATIVE

## 2013-06-20 MED ORDER — LABETALOL HCL 300 MG PO TABS
300.0000 mg | ORAL_TABLET | Freq: Three times a day (TID) | ORAL | Status: DC
  Filled 2013-06-20 (×5): qty 1

## 2013-06-20 MED ORDER — LABETALOL HCL 200 MG PO TABS
200.0000 mg | ORAL_TABLET | Freq: Three times a day (TID) | ORAL | Status: DC
  Administered 2013-06-20 – 2013-07-06 (×48): 200 mg via ORAL
  Filled 2013-06-20 (×50): qty 1

## 2013-06-20 NOTE — Progress Notes (Signed)
Pt notified that [place] is ready for her. Pt. Denies to have BP taken at this time. States we can do that when she gets back.

## 2013-06-20 NOTE — Progress Notes (Signed)
Ur chart review completed.  

## 2013-06-20 NOTE — Progress Notes (Signed)
Patient ID: Bridget Fields, female   DOB: Oct 02, 1970, 43 y.o.   MRN: [id number] Name: Bridget Fields Medical Record Number:  [id number] Date of Birth: 12-28-1970 Date of Service: 06/20/2013  43 y.o. [id number] [redacted]w[redacted]d HD#19 admitted for 28 WKS, HBP.  Pt currently stable with no complaints, no headaches, no blurry vision She denies contractions, no vaginal bleeding, no leaking of fluid. Reports good FM.  The patient's past medical history and prenatal records were reviewed.  Additional issues addressed and updated today: Patient Active Problem List   Diagnosis Date Noted  . Gestational hypertension 06/01/2013  . PIH (pregnancy induced hypertension) 05/18/2013   Family History  Problem Relation Age of Onset  . Hypertension Mother   . Diabetes Mother   . Breast cancer Mother 31  . Heart disease Father   . Hypertension Maternal Grandmother   . Hypertension Maternal Grandfather    History   Social History  . Marital Status: Married    Spouse Name: N/A    Number of Children: N/A  . Years of Education: N/A   Social History Main Topics  . Smoking status: Former Research scientist (life sciences)  . Smokeless tobacco: None  . Alcohol Use: No  . Drug Use: No  . Sexual Activity: Yes    Birth Control/ Protection: None   Other Topics Concern  . None   Social History Narrative  . None   Filed Vitals:   06/20/13 1646  BP: 130/76  Pulse: 98  Temp: 98.2 F (36.8 C)  Resp: 18     Physical Examination:   Filed Vitals:   06/20/13 1646  BP: 130/76  Pulse: 98  Temp: 98.2 F (36.8 C)  Resp: 18   General appearance - alert, well appearing, and in no distress and oriented to person, place, and time Abd  Soft, gravid, nontender Ex SCDs FHTs  150 s, moderate variability accels no decels Toco  none  Cervix: not evaluated  Results for orders placed during the hospital encounter of 06/01/13 (from the past 24 hour(s))  CBC     Status: Abnormal   Collection Time    06/20/13  5:35 AM      Result Value Ref Range   WBC 14.8 (*) 4.0 - 10.5 K/uL   RBC 3.88  3.87 - 5.11 MIL/uL   Hemoglobin 12.0  12.0 - 15.0 g/dL   HCT 36.0  36.0 - 46.0 %   MCV 92.8  78.0 - 100.0 fL   MCH 30.9  26.0 - 34.0 pg   MCHC 33.3  30.0 - 36.0 g/dL   RDW 14.5  11.5 - 15.5 %   Platelets 313  150 - 400 K/uL  COMPREHENSIVE METABOLIC PANEL     Status: Abnormal   Collection Time    06/20/13  5:35 AM      Result Value Ref Range   Sodium 138  137 - 147 mEq/L   Potassium 4.5  3.7 - 5.3 mEq/L   Chloride 102  96 - 112 mEq/L   CO2 24  19 - 32 mEq/L   Glucose, Bld 86  70 - 99 mg/dL   BUN 7  6 - 23 mg/dL   Creatinine, Ser 0.70  0.50 - 1.10 mg/dL   Calcium 10.0  8.4 - 10.5 mg/dL   Total Protein 6.5  6.0 - 8.3 g/dL   Albumin 2.5 (*) 3.5 - 5.2 g/dL   AST 30  0 - 37 U/L   ALT 45 (*) 0 - 35 U/L   Alkaline Phosphatase  124 (*) 39 - 117 U/L   Total Bilirubin 0.5  0.3 - 1.2 mg/dL   GFR calc non Af Amer >90  >90 mL/min   GFR calc Af Amer >90  >90 mL/min  TYPE AND SCREEN     Status: None   Collection Time    06/20/13  5:35 AM      Result Value Ref Range   ABO/RH(D) O POS     Antibody Screen NEG     Sample Expiration 06/23/2013      A/P:  HD#19  [redacted]w[redacted]d with Pregnancy induced hypertension. On review of last 72 hrs there are several severe range BPs  Decided to increase the labetalol to 900mg  daily, however patient angry with this management and refused therapy.  Her BP this afternoon also severe range SBP 175. Patient showed a review of her Blood pressures and was cautioned as to the  Risk of intrauterine demise, stroke etc. Patient continues to refuse increase in labetalol therefore she will continue at 200mg  BID contrary  To medical advice.   Bridget Fields Bridget Fields

## 2013-06-20 NOTE — Progress Notes (Signed)
PT NOTE Continue to monitor patient for changes/need for additional PT.  No recent changes in status.  Will continue to monitor for needs. 06/20/2013 [place], [place]

## 2013-06-21 NOTE — Progress Notes (Signed)
Reactive NST 

## 2013-06-21 NOTE — Progress Notes (Signed)
43 y.o. [id number] 52w1dHD#20 admitted for 28 WKS, HBP.  Pt currently stable with no c/o s/s of preeclampsia.  Good FM.  No leaking or bleeding.  Filed Vitals:   06/20/13 2131 06/20/13 2220 06/21/13 0609 06/21/13 0610  BP: 149/106 154/85 148/93 148/93  Pulse: 92 92 96 96  Temp:    98 F (36.7 C)  TempSrc:    Oral  Resp:  18  16  Height:      Weight:        Lungs CTA Cor RRR Abd  Soft, gravid, nontender Ex SCDs FHTs  140s, good short term variability, NST R last night. Toco  none  Results for orders placed during the hospital encounter of 06/01/13 (from the past 48 hour(s))  CBC     Status: Abnormal   Collection Time    06/20/13  5:35 AM      Result Value Ref Range   WBC 14.8 (*) 4.0 - 10.5 K/uL   RBC 3.88  3.87 - 5.11 MIL/uL   Hemoglobin 12.0  12.0 - 15.0 g/dL   HCT 36.0  36.0 - 46.0 %   MCV 92.8  78.0 - 100.0 fL   MCH 30.9  26.0 - 34.0 pg   MCHC 33.3  30.0 - 36.0 g/dL   RDW 14.5  11.5 - 15.5 %   Platelets 313  150 - 400 K/uL  COMPREHENSIVE METABOLIC PANEL     Status: Abnormal   Collection Time    06/20/13  5:35 AM      Result Value Ref Range   Sodium 138  137 - 147 mEq/L   Potassium 4.5  3.7 - 5.3 mEq/L   Chloride 102  96 - 112 mEq/L   CO2 24  19 - 32 mEq/L   Glucose, Bld 86  70 - 99 mg/dL   BUN 7  6 - 23 mg/dL   Creatinine, Ser 0.70  0.50 - 1.10 mg/dL   Calcium 10.0  8.4 - 10.5 mg/dL   Total Protein 6.5  6.0 - 8.3 g/dL   Albumin 2.5 (*) 3.5 - 5.2 g/dL   AST 30  0 - 37 U/L   ALT 45 (*) 0 - 35 U/L   Alkaline Phosphatase 124 (*) 39 - 117 U/L   Total Bilirubin 0.5  0.3 - 1.2 mg/dL   GFR calc non Af Amer >90  >90 mL/min   GFR calc Af Amer >90  >90 mL/min   Comment: (NOTE)     The eGFR has been calculated using the CKD EPI equation.     This calculation has not been validated in all clinical situations.     eGFR's persistently <90 mL/min signify possible Chronic Kidney     Disease.  TYPE AND SCREEN     Status: None   Collection Time    06/20/13  5:35 AM   Result Value Ref Range   ABO/RH(D) O POS     Antibody Screen NEG     Sample Expiration 06/23/2013      A:  HD#20  361w1dith severe PIH.  P: BPs yesterday were elevated to 17111N/35And occ diastolic to 10701 Pt was initially increased to Labetalol 300 TID but refused change.  Today BPs are more reasonable - 140s/90s.  Labs yesterday were same- other than one slightly elevated AST, stable and not in severe preeclampsia range.  D/W pt need to respect decisions of doctors.  BPP yesterday was 8/8 with good fluid and vtx baby.  Pt due for EFW on 5-30 to 6-7. Will continue current plan of management. [name]

## 2013-06-21 NOTE — Progress Notes (Signed)
Pt stated today that she definitely wants a tubal ligation after delivery.  Her insurance is [employer].  I d/w her that it is permanent and that we would make every accomodation for her to have it pp but that we may want to wait if baby is born early.  She voiced understanding.  I had long discussion about current plan and possibility of increasing meds if BPs are persistently >160/>100.

## 2013-06-22 NOTE — Progress Notes (Signed)
Pt without complaints. U/S and labs done on 5/18, all stable. B/Ps stable. Good FM Plan/ Cont with the current mgmt as listed by MFM

## 2013-06-23 MED ORDER — GUAIFENESIN ER 600 MG PO TB12
600.0000 mg | ORAL_TABLET | Freq: Four times a day (QID) | ORAL | Status: DC | PRN
  Administered 2013-06-23 – 2013-06-24 (×4): 600 mg via ORAL
  Filled 2013-06-23 (×3): qty 1

## 2013-06-23 NOTE — Progress Notes (Addendum)
Patient ID: Bridget Fields, female   DOB: 1970/06/28, 43 y.o.   MRN: [id number] Name: Bridget Fields Medical Record Number:  [id number] Date of Birth: 04-12-1970 Date of Service: 06/23/2013  43 y.o. [id number] [redacted]w[redacted]d HD#22 admitted for 28 WKS, HBP.  The patient is currently sleeping and was not disturbed.  The patient's past medical history and prenatal records were reviewed.  Additional issues addressed and updated today: Patient Active Problem List   Diagnosis Date Noted  . Gestational hypertension 06/01/2013  . PIH (pregnancy induced hypertension) 05/18/2013   Family History  Problem Relation Age of Onset  . Hypertension Mother   . Diabetes Mother   . Breast cancer Mother 44  . Heart disease Father   . Hypertension Maternal Grandmother   . Hypertension Maternal Grandfather    History   Social History  . Marital Status: Married    Spouse Name: N/A    Number of Children: N/A  . Years of Education: N/A   Social History Main Topics  . Smoking status: Former Research scientist (life sciences)  . Smokeless tobacco: None  . Alcohol Use: No  . Drug Use: No  . Sexual Activity: Yes    Birth Control/ Protection: None   Other Topics Concern  . None   Social History Narrative  . None   Filed Vitals:   06/23/13 0837  BP: 148/92  Pulse: 91  Temp: 98 F (36.7 C)  Resp: 18     Physical Examination:  Filed Vitals:   06/22/13 1606 06/22/13 2050 06/23/13 0619 06/23/13 0837  BP: 143/85 150/82 145/81 148/92  Pulse: 90 94 92 91  Temp: 98.2 F (36.8 C) 98.2 F (36.8 C) 98 F (36.7 C) 98 F (36.7 C)  TempSrc: Oral Oral Oral Oral  Resp: 18 20 18 18   Height:      Weight:         Patient sleeping  fetal tracing reviewed:  Baseline is 140 bpm. Accelerations are present, but not meeting technical criteria for reactive. However this would still be a category one tracing.  No results found for this or any previous visit (from the past 24 hour(s)).  A/P:  HD#22  [redacted]w[redacted]d with Pregnancy induced hypertension.   Blood pressure is reviewed over the last 24 hours. Her blood pressures have all been in the mild range on her current dose of medication.  Currently she is only taking labetalol 200 mg every eight hours. She has been resistant to allowing her blood pressure medication to be adjusted. Per the nursing staff, the patient will whine blood-pressure checks when she feels like her blood pressure is elevated. Will continue hospital bed rest per  MFM recommendation  Raziyah Vanvleck H. [employer]

## 2013-06-23 NOTE — Progress Notes (Signed)
Pt off the monitor after reassuring FHR

## 2013-06-23 NOTE — Progress Notes (Signed)
Instructed pt that I needed to leave her on the monitor for a little longer.

## 2013-06-24 LAB — COMPREHENSIVE METABOLIC PANEL
ALT: 40 U/L — ABNORMAL HIGH (ref 0–35)
AST: 27 U/L (ref 0–37)
Albumin: 2.4 g/dL — ABNORMAL LOW (ref 3.5–5.2)
Alkaline Phosphatase: 114 U/L (ref 39–117)
BILIRUBIN TOTAL: 0.4 mg/dL (ref 0.3–1.2)
BUN: 7 mg/dL (ref 6–23)
CHLORIDE: 103 meq/L (ref 96–112)
CO2: 22 mEq/L (ref 19–32)
Calcium: 9.4 mg/dL (ref 8.4–10.5)
Creatinine, Ser: 0.6 mg/dL (ref 0.50–1.10)
GFR calc non Af Amer: >90 mL/min (ref >90)
GLUCOSE: 84 mg/dL (ref 70–99)
Potassium: 3.9 mEq/L (ref 3.7–5.3)
Sodium: 138 mEq/L (ref 137–147)
Total Protein: 6.4 g/dL (ref 6.0–8.3)

## 2013-06-24 LAB — CBC
HCT: 34.3 % — ABNORMAL LOW (ref 36.0–46.0)
Hemoglobin: 11.2 g/dL — ABNORMAL LOW (ref 12.0–15.0)
MCH: 30.4 pg (ref 26.0–34.0)
MCHC: 32.7 g/dL (ref 30.0–36.0)
MCV: 93.2 fL (ref 78.0–100.0)
Platelets: 270 10*3/uL (ref 150–400)
RBC: 3.68 MIL/uL — ABNORMAL LOW (ref 3.87–5.11)
RDW: 14.4 % (ref 11.5–15.5)
WBC: 13.7 10*3/uL — ABNORMAL HIGH (ref 4.0–10.5)

## 2013-06-24 LAB — URIC ACID: URIC ACID, SERUM: 4 mg/dL (ref 2.4–7.0)

## 2013-06-24 LAB — LACTATE DEHYDROGENASE: LDH: 134 U/L (ref 94–250)

## 2013-06-24 NOTE — Progress Notes (Signed)
Pt sitting up on the side of the bed eating breakfast.

## 2013-06-24 NOTE — Progress Notes (Signed)
Pt off the monitor after reassuring FHR

## 2013-06-24 NOTE — Progress Notes (Signed)
Pt calls out asking for nausea medication.  Zofran 4 mg po given and pr declining assessment at this time

## 2013-06-24 NOTE — Progress Notes (Signed)
Pt off the monitor.

## 2013-06-24 NOTE — Progress Notes (Signed)
43 y.o. [id number] [redacted]w[redacted]d HD#23 admitted for 28 WKS, HBP, severe PIH.  Pt currently stable with no c/o s/s preeclampsia.  Good FM.  Filed Vitals:   06/23/13 1601 06/23/13 1640 06/23/13 2032 06/24/13 0556  BP: 163/77 163/94 166/92 156/92  Pulse: 92 87 101 89  Temp: 98.3 F (36.8 C)  97.9 F (36.6 C)   TempSrc: Oral  Oral   Resp: 20  24   Height:      Weight:         Lungs CTA Cor RRR Abd  Soft, gravid, nontender Ex SCDs FHTs  140s, good short term variability, NST R from last night- this am pending Toco  occ  Results for orders placed during the hospital encounter of 06/01/13 (from the past 24 hour(s))  COMPREHENSIVE METABOLIC PANEL     Status: Abnormal   Collection Time    06/24/13  5:48 AM      Result Value Ref Range   Sodium 138  137 - 147 mEq/L   Potassium 3.9  3.7 - 5.3 mEq/L   Chloride 103  96 - 112 mEq/L   CO2 22  19 - 32 mEq/L   Glucose, Bld 84  70 - 99 mg/dL   BUN 7  6 - 23 mg/dL   Creatinine, Ser 0.60  0.50 - 1.10 mg/dL   Calcium 9.4  8.4 - 10.5 mg/dL   Total Protein 6.4  6.0 - 8.3 g/dL   Albumin 2.4 (*) 3.5 - 5.2 g/dL   AST 27  0 - 37 U/L   ALT 40 (*) 0 - 35 U/L   Alkaline Phosphatase 114  39 - 117 U/L   Total Bilirubin 0.4  0.3 - 1.2 mg/dL   GFR calc non Af Amer >90  >90 mL/min   GFR calc Af Amer >90  >90 mL/min  LACTATE DEHYDROGENASE     Status: None   Collection Time    06/24/13  5:48 AM      Result Value Ref Range   LDH 134  94 - 250 U/L  CBC     Status: Abnormal   Collection Time    06/24/13  5:48 AM      Result Value Ref Range   WBC 13.7 (*) 4.0 - 10.5 K/uL   RBC 3.68 (*) 3.87 - 5.11 MIL/uL   Hemoglobin 11.2 (*) 12.0 - 15.0 g/dL   HCT 34.3 (*) 36.0 - 46.0 %   MCV 93.2  78.0 - 100.0 fL   MCH 30.4  26.0 - 34.0 pg   MCHC 32.7  30.0 - 36.0 g/dL   RDW 14.4  11.5 - 15.5 %   Platelets 270  150 - 400 K/uL    A:  HD#23  [redacted]w[redacted]d with severe PIH.  BPs yesterday were trending up as far as systolics- if persists >433I will add an additional labetalol to  cover time when they are increasing.  Repeat of preeclampsia labs are stable- LFTs actually better and all other labs normal.  P: Severe BPs are responding to bed rest and treatment. Pt's BPs before admission were in the stroke range and she was at severe risk of complications. Pt also is at risk for abruption and developing severe preeclampsia and therefore warrants in patient admission. Mother and baby are at too high of a risk as outpatient. BPP/AFI weekly- last done on 5-18. EFW due 5-30 to 6-7. Continue q shift NST.    [name]

## 2013-06-24 NOTE — Progress Notes (Signed)
Pharmacy notified to send dose of Mucinex per pt request

## 2013-06-25 NOTE — Progress Notes (Signed)
43 y.o. [id number] [redacted]w[redacted]d HD#24 admitted for 28 WKS, HBP.  Pt currently stable with no c/o s/s preeclampsia.  Good FM. Filed Vitals:   06/24/13 2035  BP: 147/98  Pulse: 93  Temp: 98.2 F (36.8 C)  Resp: 24    Abd  Soft, gravid, nontender Ex SCDs FHTs  140s, good short term variability, NST R- check strip from last night and yesterday afternoon- today's is P Toco  occ  No results found for this or any previous visit (from the past 24 hour(s)).  A:  HD#24  [redacted]w[redacted]d with severe PIH.  No s/s of preeclampsia.  P:Severe BPs are responding to bed rest and treatment. Pt's BPs before admission were in the stroke range and she was at severe risk of complications. Pt also is at risk for abruption and developing severe preeclampsia and therefore warrants in patient admission. Mother and baby are at too high of a risk as outpatient.  BPP/AFI weekly- last done on 5-18.  EFW due 5-30 to 6-7.  Continue q shift NST.    [name]

## 2013-06-26 NOTE — Progress Notes (Signed)
43 y.o. [id number] [redacted]w[redacted]d HD#25 admitted for 28 WKS, HBP.  Pt currently stable with no c/o s/s preeclampsis.  Good FM.  Filed Vitals:   06/25/13 1859 06/25/13 2100 06/26/13 0640 06/26/13 0835  BP:  143/67 141/83   Pulse:  102 87   Temp:  98 F (36.7 C) 98 F (36.7 C) 98 F (36.7 C)  TempSrc:  Oral Oral Oral  Resp: 18 20 20    Height:      Weight:        Abd  Soft, gravid, nontender Ex SCDs FHTs  130s, good short term variability, NST R- this am Toco  none  No results found for this or any previous visit (from the past 24 hour(s)).  A:  HD#25  [redacted]w[redacted]d with severe PIH.  P: P:Severe BPs are responding to bed rest and treatment. Pt's BPs before admission were in the stroke range and she was at severe risk of complications. Pt also is at risk for abruption and developing severe preeclampsia and therefore warrants in patient admission. Mother and baby are at too high of a risk as outpatient.  BPP/AFI weekly- last done on 5-18.  EFW due 5-30 to 6-7.  Continue q shift NST.   [name]

## 2013-06-27 NOTE — Progress Notes (Signed)
Pt up to w/c ride

## 2013-06-27 NOTE — Progress Notes (Signed)
Pt sitting on the side of the bed to eat breakfast and wants to defer VS til she is put on the fetal monitor

## 2013-06-27 NOTE — Progress Notes (Signed)
HD#26 admitted at 66 with HBP.  No complaints: no HA/vision change/RUQ pain.  Good fetal movement.  Filed Vitals:   06/26/13 2030 06/26/13 2103 06/27/13 0834 06/27/13 1232  BP:   141/70 147/86  Pulse:   90 86  Temp:   98.2 F (36.8 C) 98.3 F (36.8 C)  TempSrc:   Oral Oral  Resp: 18 18 18 20   Height:      Weight:        Gen: NAD  Lab Results  Component Value Date   WBC 13.7* 06/24/2013   HGB 11.2* 06/24/2013   HCT 34.3* 06/24/2013   MCV 93.2 06/24/2013   PLT 270 06/24/2013    --/--/O POS (05/18 0535)  A/P HD #26 with CHTN, superimposed GHTN per MFM Weekly labs: last labs 5/22/ essentially stable Weekly [place] and BPP scheduled for tomorrow. Cont. Labetalol 200tid - BPs normal and mild range NST tid Recommended that pt wear SCDs while in bed due to increased risk of DVT/PE Other routine care.  Routine care.   Allyn Kenner

## 2013-06-27 NOTE — Progress Notes (Signed)
Ur chart review completed.  

## 2013-06-27 NOTE — Progress Notes (Signed)
Pt off the monitor after reassuring FHR

## 2013-06-28 ENCOUNTER — Inpatient Hospital Stay (HOSPITAL_COMMUNITY): Payer: Commercial Managed Care - PPO

## 2013-06-28 NOTE — Progress Notes (Signed)
[facility] ultrasound  Indication: 43 yr old G3P0111 at [redacted]w[redacted]d with chronic hypertension with supsicion for superimposed gestational hypertension for BPP. Remote read.  Findings: 1. Single intrauterine pregnancy. 2.  Anterior placenta without evidence of previa. 3. Normal amniotic fluid index; although increased for gestational age. 4. Normal biophysical profile of 8/8. 5. Anterior fibroid measuring 3.6cm.  Recommendations: 1.  Advanced maternal age: - previously counseled - had normal cell free fetal DNA - fetal surveillance as below 2. Slightly increased AFI: - I do not see a glucola result - may consider checking sugars or glucola- patient has strong family history of diabetes 3. Hypertension: - previously counseled - on labetalol - recommend fetal growth every 4 weeks; due in 1 week - recommend weekly CBC, AST, ALT, creatinine - recommend continue antenatal testing - delivery timing recommendations based on clinical picture but no later than 37 weeks  [place], [place]

## 2013-06-28 NOTE — Progress Notes (Signed)
Pt off the monitor after reassuring FHR

## 2013-06-28 NOTE — Progress Notes (Signed)
UR chart review completed.  

## 2013-06-28 NOTE — Progress Notes (Signed)
Patient ID: Bridget Fields, female   DOB: July 29, 1970, 43 y.o.   MRN: [id number]  HD# 60 with cHTN @ 31+1  S: Pt feeling well, denies HA, vision changes. Notes active FM O: Filed Vitals:   06/27/13 2134 06/27/13 2200 06/27/13 2300 06/28/13 0801  BP:    150/82  Pulse:    90  Temp:    98 F (36.7 C)  TempSrc:    Oral  Resp: 18 18 18 20   Height:      Weight:       AOX3, NAD Abd soft, NT/ND FHR 140-150  Cvx deferred Toco: quiet  A/P 1) Continue BR w/ BRP 2) Cont Labetalol TID 3) [place] today 4) Labs stable on 06/24/13

## 2013-06-29 LAB — CBC
HEMATOCRIT: 33.3 % — AB (ref 36.0–46.0)
Hemoglobin: 11.3 g/dL — ABNORMAL LOW (ref 12.0–15.0)
MCH: 31.1 pg (ref 26.0–34.0)
MCHC: 33.9 g/dL (ref 30.0–36.0)
MCV: 91.7 fL (ref 78.0–100.0)
Platelets: 298 10*3/uL (ref 150–400)
RBC: 3.63 MIL/uL — ABNORMAL LOW (ref 3.87–5.11)
RDW: 14.4 % (ref 11.5–15.5)
WBC: 12.5 10*3/uL — ABNORMAL HIGH (ref 4.0–10.5)

## 2013-06-29 LAB — URIC ACID: Uric Acid, Serum: 4.8 mg/dL (ref 2.4–7.0)

## 2013-06-29 LAB — COMPREHENSIVE METABOLIC PANEL
ALT: 29 U/L (ref 0–35)
AST: 21 U/L (ref 0–37)
Albumin: 2.4 g/dL — ABNORMAL LOW (ref 3.5–5.2)
Alkaline Phosphatase: 116 U/L (ref 39–117)
BUN: 5 mg/dL — ABNORMAL LOW (ref 6–23)
CALCIUM: 9.1 mg/dL (ref 8.4–10.5)
CO2: 22 mEq/L (ref 19–32)
CREATININE: 0.64 mg/dL (ref 0.50–1.10)
Chloride: 102 mEq/L (ref 96–112)
GLUCOSE: 124 mg/dL — AB (ref 70–99)
Potassium: 3.7 mEq/L (ref 3.7–5.3)
Sodium: 136 mEq/L — ABNORMAL LOW (ref 137–147)
TOTAL PROTEIN: 6.6 g/dL (ref 6.0–8.3)
Total Bilirubin: 0.5 mg/dL (ref 0.3–1.2)

## 2013-06-29 LAB — LACTATE DEHYDROGENASE: LDH: 130 U/L (ref 94–250)

## 2013-06-29 LAB — GLUCOSE TOLERANCE, 1 HOUR
GLUCOSE 1 HOUR GTT: 87 mg/dL (ref 70–140)
Glucose, 1 Hour GTT: 127 mg/dL (ref 70–140)

## 2013-06-29 NOTE — Progress Notes (Signed)
Pt without complaints. Having one hour glucola today.  VSSAF IMP/ stable, no change. Plan/ Will add [place] labs for this week to one hour today.

## 2013-06-29 NOTE — Progress Notes (Signed)
Pt tearful. Received phone call from insurance company stating that it would no longer cover her stay. Case management called and they will follow up with her insurance company.

## 2013-06-29 NOTE — Progress Notes (Signed)
Pt tearful and requesting no intervention at this time.  Will postpone monitoring and vital signs at this time. Gave reassurance and asked pt to call when she was ready.

## 2013-06-30 NOTE — Progress Notes (Addendum)
Name: Bridget Fields Medical Record Number:  [id number] Date of Birth: 09-14-1970 Date of Service: 06/30/2013  43 y.o. [id number] [redacted]w[redacted]d HD#29 admitted for 28 WKS, HBP now patient is 31 weeks 3 days Pt currently stable with no c/o. She denies contractions, no vaginal bleeding, no leaking of fluid. Reports good FM.  The patient's past medical history and prenatal records were reviewed.  Additional issues addressed and updated today:  Patient Active Problem List   Diagnosis Date Noted  . Gestational hypertension 06/01/2013  . PIH (pregnancy induced hypertension) 05/18/2013   Family History  Problem Relation Age of Onset  . Hypertension Mother   . Diabetes Mother   . Breast cancer Mother 2  . Heart disease Father   . Hypertension Maternal Grandmother   . Hypertension Maternal Grandfather    History   Social History  . Marital Status: Married    Spouse Name: N/A    Number of Children: N/A  . Years of Education: N/A   Social History Main Topics  . Smoking status: Former Research scientist (life sciences)  . Smokeless tobacco: None  . Alcohol Use: No  . Drug Use: No  . Sexual Activity: Yes    Birth Control/ Protection: None   Other Topics Concern  . None   Social History Narrative  . None   Filed Vitals:   06/30/13 0625  BP: 151/79  Pulse: 88  Temp:   Resp:     Physical Examination:   Filed Vitals:   06/30/13 0625  BP: 151/79  Pulse: 88  Temp:   Resp:    General appearance - alert, well appearing, and in no distress, oriented to person, place, and time and tearful Mental status - alert, oriented to person, place, and time, depressed mood Lungs CTA Cor RRR Abd  Soft, gravid, nontender Ex SCDs FHTs  145s, moderate variability accels no decels Toco  none  Cervix: not evaluated   A:  HD#29  [redacted]w[redacted]d with Chronic Hypertension superimposed gestational hypertension, now with normalized ALT.  P: Continue current labetalol regimen TID as BP is mild range, no s/sx of pre-eclampsia Difficulty  with insurance as patient was called yesterday afternoon and told she would not be covered for hospital stay,  Social work / case Management at bedside to try to appeal this ruling. Will consult with Case management as to follow up  Later in the day.  Bridget Fields  Addendum note: Discussed case with other [facility] [employer] we agree that it is imperative that the patient continue to remain as an inpatient for the duration of her pregnancy due to the high risk nature of the pregnancy.  Patient is being followed also by Maternal fetal Medicine and the final assessment is that patient is very high risk due to her h/o  Chronic hypertension with superimposed gestational hypertension and a prior history of severe pre-eclampsia in a previous pregnancy.  Patient has had many episodes in the past week of severe range systolic and diastolic blood pressures which put her at risk for eclamptic  Seizure, stroke, maternal and or fetal death. All physicians involved want her to remain as an inpatient to monitor her symptoms, labs, fetal well being until delivery.  Bridget Fields

## 2013-07-01 NOTE — Progress Notes (Signed)
Reactive NST 

## 2013-07-02 NOTE — Progress Notes (Addendum)
Name: Bridget Fields Medical Record Number:  [id number] Date of Birth: January 12, 1971 Date of Service: 07/02/2013  43 y.o. [id number] [redacted]w[redacted]d HD#31 admitted for 65 WKS, HBP now patient is 31 weeks 3 days  Patient is currently stable. Continues to throw severe range pressures unpredictably. Active FM, no VB.   The patient's past medical history and prenatal records were reviewed.  Additional issues addressed and updated today:  Patient Active Problem List   Diagnosis Date Noted  . Gestational hypertension 06/01/2013  . PIH (pregnancy induced hypertension) 05/18/2013   Family History  Problem Relation Age of Onset  . Hypertension Mother   . Diabetes Mother   . Breast cancer Mother 64  . Heart disease Father   . Hypertension Maternal Grandmother   . Hypertension Maternal Grandfather    History   Social History  . Marital Status: Married    Spouse Name: N/A    Number of Children: N/A  . Years of Education: N/A   Social History Main Topics  . Smoking status: Former Research scientist (life sciences)  . Smokeless tobacco: None  . Alcohol Use: No  . Drug Use: No  . Sexual Activity: Yes    Birth Control/ Protection: None   Other Topics Concern  . None   Social History Narrative  . None   Filed Vitals:   07/02/13 0920  BP: 136/75  Pulse: 92  Temp: 98.4 F (36.9 C)  Resp: 18    Physical Examination:   Filed Vitals:   07/02/13 0920  BP: 136/75  Pulse: 92  Temp: 98.4 F (36.9 C)  Resp: 18   General appearance - alert, well appearing, and in no distress, oriented to person, place, and time and tearful Mental status - alert, oriented to person, place, and time, depressed mood Abd  Soft, gravid, nontender Ex SCDs FHTs  145s, moderate variability accels no decels Toco  none  Cervix: not evaluated   A:  HD#31  [redacted]w[redacted]d with Chronic Hypertension superimposed gestational hypertension, now with normalized ALT.  P: 1) Continue current labetalol regimen TID as BP is mild range, no s/sx of  pre-eclampsia 2) Continued inpatient management is recommended. Patient is being managed jointly with Maternal fetal Medicine and the final assessment is that patient is very high risk due to her h/o Chronic hypertension with superimposed gestational hypertension and a prior history of severe pre-eclampsia in a previous pregnancy. Patient has had many episodes in the past week of severe range systolic and diastolic blood pressures which put her at risk for eclamptic  Seizure, stroke, maternal and or fetal death. All physicians involved want her to remain as an inpatient to monitor her symptoms, labs, fetal well being until delivery.   Farrel Gobble. [place]

## 2013-07-03 ENCOUNTER — Inpatient Hospital Stay (HOSPITAL_COMMUNITY): Payer: Commercial Managed Care - PPO

## 2013-07-03 NOTE — Progress Notes (Addendum)
Name: Bridget Fields Medical Record Number:  [id number] Date of Birth: Mar 29, 1970 Date of Service: 07/03/2013  43 y.o. [id number] [redacted]w[redacted]d HD#32 admitted for 28 WKS, HBP now patient is 31 weeks 6 days  Patient is currently stable. Continues to throw severe range pressures unpredictably. Active FM, no VB.   The patient's past medical history and prenatal records were reviewed.  Additional issues addressed and updated today:  Patient Active Problem List   Diagnosis Date Noted  . Gestational hypertension 06/01/2013  . PIH (pregnancy induced hypertension) 05/18/2013   Family History  Problem Relation Age of Onset  . Hypertension Mother   . Diabetes Mother   . Breast cancer Mother 21  . Heart disease Father   . Hypertension Maternal Grandmother   . Hypertension Maternal Grandfather    History   Social History  . Marital Status: Married    Spouse Name: N/A    Number of Children: N/A  . Years of Education: N/A   Social History Main Topics  . Smoking status: Former Research scientist (life sciences)  . Smokeless tobacco: None  . Alcohol Use: No  . Drug Use: No  . Sexual Activity: Yes    Birth Control/ Protection: None   Other Topics Concern  . None   Social History Narrative  . None   Filed Vitals:   07/03/13 1100  BP:   Pulse:   Temp:   Resp: 18    Physical Examination:   Filed Vitals:   07/03/13 1100  BP:   Pulse:   Temp:   Resp: 18   General appearance - alert, well appearing, and in no distress, oriented to person, place, and time and tearful Mental status - alert, oriented to person, place, and time, depressed mood Abd  Soft, gravid, nontender Ex SCDs FHTs  145s, moderate variability accels no decels Toco  none  Cervix: not evaluated   A:  HD#32  [redacted]w[redacted]d with Chronic Hypertension superimposed gestational hypertension, now with normalized ALT.  P: 1) Continue current labetalol regimen TID as BP is mild range, no s/sx of pre-eclampsia 2) Continued inpatient management is recommended.  Patient is being managed jointly with Maternal fetal Medicine and the final assessment is that patient is very high risk due to her h/o Chronic hypertension with superimposed gestational hypertension and a prior history of severe pre-eclampsia in a previous pregnancy. Patient has had many episodes in the past week of severe range systolic and diastolic blood pressures which put her at risk for eclamptic  Seizure, stroke, maternal and or fetal death. All physicians involved want her to remain as an inpatient to monitor her symptoms, labs, fetal well being until delivery. 3) BPP/AFI ordered for the morning 4) Repeat preeclampsia labs for 07/06/13   Tillie Rung H. [place]

## 2013-07-04 ENCOUNTER — Ambulatory Visit (HOSPITAL_COMMUNITY): Payer: Commercial Managed Care - PPO

## 2013-07-04 ENCOUNTER — Inpatient Hospital Stay (HOSPITAL_COMMUNITY): Payer: Commercial Managed Care - PPO

## 2013-07-04 MED ORDER — PANTOPRAZOLE SODIUM 40 MG PO TBEC
40.0000 mg | DELAYED_RELEASE_TABLET | Freq: Every day | ORAL | Status: DC
  Administered 2013-07-04 – 2013-07-05 (×2): 40 mg via ORAL
  Filled 2013-07-04 (×3): qty 1

## 2013-07-04 MED ORDER — ALUM HYDROXIDE-MAG CARBONATE 95-358 MG/15ML PO SUSP
30.0000 mL | Freq: Three times a day (TID) | ORAL | Status: DC
  Administered 2013-07-04 – 2013-07-05 (×3): 30 mL via ORAL
  Filled 2013-07-04 (×9): qty 30

## 2013-07-04 MED ORDER — PRENATAL MULTIVITAMIN CH
1.0000 | ORAL_TABLET | Freq: Every day | ORAL | Status: DC
  Administered 2013-07-05 – 2013-07-06 (×2): 1 via ORAL

## 2013-07-04 MED ORDER — SIMETHICONE 80 MG PO CHEW: 80.0000 mg | CHEWABLE_TABLET | Freq: Four times a day (QID) | ORAL | Status: DC

## 2013-07-04 MED ORDER — RANITIDINE HCL 150 MG/10ML PO SYRP: 150.0000 mg | ORAL_SOLUTION | Freq: Two times a day (BID) | ORAL | Status: DC

## 2013-07-04 NOTE — Progress Notes (Signed)
Ur chart review completed.  

## 2013-07-04 NOTE — Progress Notes (Addendum)
Name: Joelie Schou Medical Record Number:  [id number] Date of Birth: [birth year] Date of Service: 07/04/2013  43 y.o. [id number] [redacted]w[redacted]d HD#33 admitted at  57 WKS, for uncontrolled  HBP. Diagnosed with CHTN with superimposed Gestational HTN  Pt currently stable c/o bad heartburn that is causing vomiting. she denies HA, RUQ pain no visual changes. She denies contractions, no vaginal bleeding, no leaking of fluid. Reports good FM.  The patient's past medical history and prenatal records were reviewed.  Additional issues addressed and updated today: Patient Active Problem List   Diagnosis Date Noted  . Gestational hypertension 06/01/2013  . PIH (pregnancy induced hypertension) 05/18/2013   Family History  Problem Relation Age of Onset  . Hypertension Mother   . Diabetes Mother   . Breast cancer Mother 73  . Heart disease Father   . Hypertension Maternal Grandmother   . Hypertension Maternal Grandfather    History   Social History  . Marital Status: Married    Spouse Name: N/A    Number of Children: N/A  . Years of Education: N/A   Social History Main Topics  . Smoking status: Former Research scientist (life sciences)  . Smokeless tobacco: None  . Alcohol Use: No  . Drug Use: No  . Sexual Activity: Yes    Birth Control/ Protection: None   Other Topics Concern  . None   Social History Narrative  . None   Filed Vitals:   07/04/13 0840  BP: 129/68  Pulse: 90  Temp: 98.1 F (36.7 C)  Resp: 16     Physical Examination:   Filed Vitals:   07/04/13 0840  BP: 129/68  Pulse: 90  Temp: 98.1 F (36.7 C)  Resp: 16   General appearance - alert, well appearing, and in no distress and oriented to person, place, and time Mental status - alert, oriented to person, place, and time, normal mood, behavior, speech, dress, motor activity, and thought processes Pelvic - examination not indicated Neurological - alert, oriented, normal speech, no focal findings or movement disorder noted, DTR's normal and  symmetric Abd  Soft, gravid, nontender Ex SCDs FHTs  150s, moderate variability accels no decels Toco  No ctx   No results found for this or any previous visit (from the past 24 hour(s)). OB  scan performed this am, results pending  A:  HD#33  [redacted]w[redacted]d with Chronic HTN with superimposed gestational HTN BPs in mild range with occasional severe over Weekend.3  P: 1) Continue current labetalol regimen TID as BP is mild range, no s/sx of pre-eclampsia  2) Continued inpatient management is recommended. Patient is being managed jointly with Maternal fetal Medicine and the final assessment is that patient is very high risk due to her h/o Chronic hypertension with superimposed gestational hypertension and a prior history of severe pre-eclampsia in a previous pregnancy. Patient has had many episodes in the past week of severe range systolic and diastolic blood pressures which put her at risk for eclamptic  Seizure, stroke, maternal and or fetal death. All physicians involved want her to remain as an inpatient to monitor her symptoms, labs,  fetal well being until delivery.  3) will f/u on results of ultrasound this am 4) Repeat preeclampsia labs for 07/06/13 5) Will change Pepcid to Zantac and add Mylanta or GI cocktail to regimen  Sanjuana Kava

## 2013-07-05 NOTE — Progress Notes (Signed)
Bridget Fields has received notice from her insurance company that they will not cover this hospital stay.  She is refusing FM and any medications and food provided by the hospital, just at this time is not considering leaving against medical advice. I spoke with Danae Chen at bedside and she agreed to continued intermittent fetal monitoring.  Her husband will bring her PNV and Labetalol from home which will be reviewed and dispensed by pharmacy.  I spoke with Dr. Donne Hazel with MFM who will see her tomorrow in re-consultation and provide further recommendations.

## 2013-07-05 NOTE — Progress Notes (Signed)
HD#34 CHTN with superimprosed GHTN.  No HA/vision change/RUQ pain.  No other complaints, good FM.  GERD symptoms improved.  Filed Vitals:   07/04/13 1605 07/04/13 2136 07/05/13 0615 07/05/13 0926  BP: 161/95 130/72 130/72 149/71  Pulse: 87 89 91 92  Temp: 98 F (36.7 C) 98.2 F (36.8 C) 98.2 F (36.8 C) 98 F (36.7 C)  TempSrc: Oral Oral Oral Oral  Resp: 16 16 16 18   Height:      Weight:       FHT: NST R this am No CT  Lab Results  Component Value Date   WBC 12.5* 06/29/2013   HGB 11.3* 06/29/2013   HCT 33.3* 06/29/2013   MCV 91.7 06/29/2013   PLT 298 06/29/2013    A/P HD#34 @ [redacted]w[redacted]d with Chronic HTN with superimposed gestational HTN BPs normal and mild range with occasional severe P:  1) Continue current labetalol regimen TID as BP is mild range, no s/sx of pre-eclampsia  2) Continued inpatient management is recommended. Patient is being managed jointly with Maternal fetal Medicine and the final assessment is that patient is very high risk due to her h/o Chronic hypertension with superimposed gestational hypertension and a prior history of severe pre-eclampsia in a previous pregnancy. Patient has had many episodes in the past week of severe range systolic and diastolic blood pressures which put her at risk for eclamptic  Seizure, stroke, maternal and or fetal death. All physicians involved want her to remain as an inpatient to monitor her symptoms, labs,  fetal well being until delivery.  3)  Repeat preeclampsia labs for 07/06/13    Allyn Kenner

## 2013-07-05 NOTE — Progress Notes (Signed)
Bridget Fields is requesting that I notify night shift not perform EFM or give her medications.

## 2013-07-05 NOTE — Progress Notes (Signed)
Dr. Rogue Bussing came to see Bridget Fields. Bridget Fields states she is currently consenting to [facility] and her husband will bring her Labetalol  tomorrow for her to take. Bridget Fields is aware that her Labetalol will need to be sent to the pharmacy for labeling.

## 2013-07-05 NOTE — Progress Notes (Signed)
Patient states her insurance is denying her stay and patient is refusing fetal monitoring and all medication but labetalol which she states she will get her husband to bring from home. Case management notified. Dr. Rogue Bussing notified patient is refusing care. She states she will speak with MFM.

## 2013-07-06 ENCOUNTER — Ambulatory Visit (HOSPITAL_COMMUNITY): Payer: Commercial Managed Care - PPO

## 2013-07-06 LAB — COMPREHENSIVE METABOLIC PANEL
ALT: 27 U/L (ref 0–35)
AST: 21 U/L (ref 0–37)
Albumin: 2.3 g/dL — ABNORMAL LOW (ref 3.5–5.2)
Alkaline Phosphatase: 120 U/L — ABNORMAL HIGH (ref 39–117)
BILIRUBIN TOTAL: 0.4 mg/dL (ref 0.3–1.2)
BUN: 5 mg/dL — AB (ref 6–23)
CHLORIDE: 102 meq/L (ref 96–112)
CO2: 23 meq/L (ref 19–32)
Calcium: 8.9 mg/dL (ref 8.4–10.5)
Creatinine, Ser: 0.67 mg/dL (ref 0.50–1.10)
Glucose, Bld: 88 mg/dL (ref 70–99)
Potassium: 4 mEq/L (ref 3.7–5.3)
SODIUM: 138 meq/L (ref 137–147)
Total Protein: 5.7 g/dL — ABNORMAL LOW (ref 6.0–8.3)

## 2013-07-06 LAB — CBC
HEMATOCRIT: 32.2 % — AB (ref 36.0–46.0)
HEMOGLOBIN: 10.8 g/dL — AB (ref 12.0–15.0)
MCH: 31.1 pg (ref 26.0–34.0)
MCHC: 33.5 g/dL (ref 30.0–36.0)
MCV: 92.8 fL (ref 78.0–100.0)
Platelets: 274 10*3/uL (ref 150–400)
RBC: 3.47 MIL/uL — AB (ref 3.87–5.11)
RDW: 14.5 % (ref 11.5–15.5)
WBC: 12.8 10*3/uL — AB (ref 4.0–10.5)

## 2013-07-06 LAB — URIC ACID: Uric Acid, Serum: 4.7 mg/dL (ref 2.4–7.0)

## 2013-07-06 LAB — LACTATE DEHYDROGENASE: LDH: 139 U/L (ref 94–250)

## 2013-07-06 MED ORDER — LABETALOL HCL 300 MG PO TABS: 300.0000 mg | ORAL_TABLET | Freq: Three times a day (TID) | ORAL | Status: DC

## 2013-07-06 MED ORDER — ALUM HYDROXIDE-MAG CARBONATE 95-358 MG/15ML PO SUSP: 30.0000 mL | Freq: Three times a day (TID) | ORAL | Status: DC

## 2013-07-06 NOTE — Consult Note (Signed)
Maternal Fetal Medicine Consultation  Requesting Provider(s): Allyn Kenner, DO  Reason for consultation: Chronic hypertension with superimposed, severe gestational hypertension  RCV:[id number] Bridget Fields is a 43 yo G3P0111, EDD 08/29/2013, currently at [facility] who is currently admitted due to chronic hypertension with superimposed severe gestational hypertension. She was first admitted on 4/29 after noting to have some severe range blood pressures during her clinic visit. She completed a course of betamethasone on 4/14 and 4/15.  She has since been started on Labetalol - currently on 200 mg TID with some improvement and only occasional severe range blood pressures.  On admission, her LFTs were minimally elevated (ALT in low 40's) which have since normalized.  Her most recent 24-hr urine protein was 158 mg on 06/09/2013.  Preeclampsia labs today are otherwise within normal limits.  Due to the patients labile blood pressures with some severe range values, the plan had been for continued inpatient management until 34 weeks with delivery at that time.  Yesterday, the patient received notification that her insurance will no longer cover her continue inpatient care.  I was asked to see her and make recommendations for further management given this information.  Ms. Dray reports a history of severe preeclampsia with an SVD at 71 weeks.  She denies blood pressure problems outside of pregnancy - reports that her blood pressures "stated creeping up at the 3-4th month of her pregnancy".  She is without complaints today.  The fetus is active.  She denies headaches, visual changes or RUQ pain.  OB History: OB History   Grav Para Term Preterm Abortions TAB SAB Ect Mult Living   3 1 0 1 1  1   1       PMH:  Past Medical History  Diagnosis Date  . Hypertension   . Miscarriage   . Abnormal Pap smear 1992 2014    1992 Leep, 02/2012 ASCUS HPV neg  . HPV in female     PSH:  Past Surgical History  Procedure Laterality  Date  . Knee arthrocentesis  plate and screws  . Cervical biopsy  w/ loop electrode excision     Meds:  Scheduled Meds: . aluminum hydroxide-magnesium carbonate  30 mL Oral TID PC & HS  . docusate sodium  100 mg Oral Daily  . labetalol  200 mg Oral 3 times per day  . pantoprazole  40 mg Oral Daily  . prenatal multivitamin  1 tablet Oral Q1200   Continuous Infusions:  PRN Meds:.acetaminophen, calcium carbonate, guaiFENesin, ondansetron, zolpidem  Allergies:  Allergies  Allergen Reactions  . Aspirin Hives   FH:  Family History  Problem Relation Age of Onset  . Hypertension Mother   . Diabetes Mother   . Breast cancer Mother 33  . Heart disease Father   . Hypertension Maternal Grandmother   . Hypertension Maternal Grandfather    Soc:  History   Social History  . Marital Status: Married    Spouse Name: N/A    Number of Children: N/A  . Years of Education: N/A   Occupational History  . Not on file.   Social History Main Topics  . Smoking status: Former Research scientist (life sciences)  . Smokeless tobacco: Not on file  . Alcohol Use: No  . Drug Use: No  . Sexual Activity: Yes    Birth Control/ Protection: None   Other Topics Concern  . Not on file   Social History Narrative  . No narrative on file    Review of Systems: no vaginal  bleeding or cramping/contractions, no LOF, no nausea/vomiting. All other systems reviewed and are negative.  PE:   Filed Vitals:   07/06/13 0828  BP: 138/73  Pulse: 92  Temp: 97.9 F (36.6 C)  Resp: 20  Recent BPs: 155/95, 153/94, 142/60, 138/77, 161/95. 156/76  GEN: well-appearing female ABD: gravid, NT  Ultrasound (07/05/2013) EFW at the 33rd %tile BPP 8/8 AFI 19 cm  Labs:  CBC    Component Value Date/Time   WBC 12.8* 07/06/2013 0720   RBC 3.47* 07/06/2013 0720   HGB 10.8* 07/06/2013 0720   HCT 32.2* 07/06/2013 0720   PLT 274 07/06/2013 0720   MCV 92.8 07/06/2013 0720   MCH 31.1 07/06/2013 0720   MCHC 33.5 07/06/2013 0720   RDW 14.5 07/06/2013 0720    CMP     Component Value Date/Time   NA 138 07/06/2013 0720   K 4.0 07/06/2013 0720   CL 102 07/06/2013 0720   CO2 23 07/06/2013 0720   GLUCOSE 88 07/06/2013 0720   BUN 5* 07/06/2013 0720   CREATININE 0.67 07/06/2013 0720   CREATININE 0.71 06/08/2013 1400   CALCIUM 8.9 07/06/2013 0720   PROT 5.7* 07/06/2013 0720   ALBUMIN 2.3* 07/06/2013 0720   AST 21 07/06/2013 0720   ALT 27 07/06/2013 0720   ALKPHOS 120* 07/06/2013 0720   BILITOT 0.4 07/06/2013 0720   GFRNONAA >90 07/06/2013 0720   GFRAA >90 07/06/2013 0720   LDH 139 Uric acid 4.7  A/P: 1) Single IUP at 32w 2d 2) Chronic hypertension with superimposed severe gestational hypertension - initial plan was for inpatient management for the duration of pregnancy with tentative plans for delivery at 34 weeks.  The patient initially had some minimally elevated LFTs (ALT) which has since normalized.  She has been relatively stable throughout the course of her admission.  Preeclampsia labs today are within normal limits, although she continues to have labile blood pressures with some values in the severe range despite her current dose of Labetalol.  With recent notification that her insurance will no longer cover the remainder of her hospitalization, I feel that it would be reasonable to discharge the patient home with strict activity levels and very close outpatient follow up.  Recommendations: 1) Would increase dose of Labetalol to 300 mg every 8 hours 2) Recommend at least twice weekly outpatient clinic visits for blood pressure checks.  Recommend weekly preeclampsia labs.   3) 2x weekly NSTs with weekly AFIs 4) Preeclampsia precautions - would have low threshold to re-admit if there is any clinical change 5) If the patient continues to have some severe ranged blood pressures or worsening clinical status, would recommend delivery at 34 weeks.  If blood pressures improve and she is otherwise stable, would continue expectant management, but would deliver no later than [redacted]  weeks gestation.  Discussed plan with Dr. Harrington Challenger.  Thank you for the opportunity to be a part of the care of Joanna Hews. Please contact our office if we can be of further assistance.   I spent approximately 30 minutes with this patient with over 50% of time spent in face-to-face counseling.  Benjaman Lobe, MD Maternal Fetal Medicine

## 2013-07-06 NOTE — Progress Notes (Signed)
Pt refusing to be monitored at this time, however, pt does admit to +FM

## 2013-07-15 LAB — OB RESULTS CONSOLE GBS: GBS: NEGATIVE

## 2013-07-18 ENCOUNTER — Inpatient Hospital Stay (HOSPITAL_COMMUNITY)
Admission: AD | Admit: 2013-07-18 | Discharge: 2013-07-21 | DRG: 765 | Disposition: A | Discharge status: Home / Self Care | Payer: Commercial Managed Care - PPO | Source: Ambulatory Visit | Attending: Obstetrics & Gynecology | Admitting: Obstetrics & Gynecology

## 2013-07-18 ENCOUNTER — Telehealth (HOSPITAL_COMMUNITY): Payer: Self-pay | Admitting: *Deleted

## 2013-07-18 DIAGNOSIS — Z6841 Body Mass Index (BMI) 40.0 and over, adult: Secondary | ICD-10-CM

## 2013-07-18 DIAGNOSIS — Z833 Family history of diabetes mellitus: Secondary | ICD-10-CM

## 2013-07-18 DIAGNOSIS — O99214 Obesity complicating childbirth: Secondary | ICD-10-CM

## 2013-07-18 DIAGNOSIS — O139 Gestational [pregnancy-induced] hypertension without significant proteinuria, unspecified trimester: Secondary | ICD-10-CM

## 2013-07-18 DIAGNOSIS — Z302 Encounter for sterilization: Secondary | ICD-10-CM

## 2013-07-18 DIAGNOSIS — Z87891 Personal history of nicotine dependence: Secondary | ICD-10-CM

## 2013-07-18 DIAGNOSIS — O09529 Supervision of elderly multigravida, unspecified trimester: Secondary | ICD-10-CM | POA: Diagnosis present

## 2013-07-18 DIAGNOSIS — B977 Papillomavirus as the cause of diseases classified elsewhere: Secondary | ICD-10-CM | POA: Diagnosis present

## 2013-07-18 DIAGNOSIS — O98519 Other viral diseases complicating pregnancy, unspecified trimester: Secondary | ICD-10-CM | POA: Diagnosis present

## 2013-07-18 DIAGNOSIS — E669 Obesity, unspecified: Secondary | ICD-10-CM | POA: Diagnosis present

## 2013-07-18 DIAGNOSIS — O119 Pre-existing hypertension with pre-eclampsia, unspecified trimester: Secondary | ICD-10-CM | POA: Diagnosis present

## 2013-07-18 DIAGNOSIS — O1002 Pre-existing essential hypertension complicating childbirth: Principal | ICD-10-CM | POA: Diagnosis present

## 2013-07-18 LAB — CBC
HCT: 34.8 % — ABNORMAL LOW (ref 36.0–46.0)
HEMOGLOBIN: 11.9 g/dL — AB (ref 12.0–15.0)
MCH: 31.2 pg (ref 26.0–34.0)
MCHC: 34.2 g/dL (ref 30.0–36.0)
MCV: 91.3 fL (ref 78.0–100.0)
PLATELETS: 327 10*3/uL (ref 150–400)
RBC: 3.81 MIL/uL — ABNORMAL LOW (ref 3.87–5.11)
RDW: 14.3 % (ref 11.5–15.5)
WBC: 12.3 10*3/uL — ABNORMAL HIGH (ref 4.0–10.5)

## 2013-07-18 LAB — COMPREHENSIVE METABOLIC PANEL
ALK PHOS: 159 U/L — AB (ref 39–117)
ALT: 22 U/L (ref 0–35)
AST: 20 U/L (ref 0–37)
Albumin: 2.5 g/dL — ABNORMAL LOW (ref 3.5–5.2)
BILIRUBIN TOTAL: 0.4 mg/dL (ref 0.3–1.2)
BUN: 8 mg/dL (ref 6–23)
CO2: 19 meq/L (ref 19–32)
CREATININE: 0.69 mg/dL (ref 0.50–1.10)
Calcium: 9.6 mg/dL (ref 8.4–10.5)
Chloride: 103 mEq/L (ref 96–112)
GFR calc Af Amer: >90 mL/min (ref >90)
Glucose, Bld: 95 mg/dL (ref 70–99)
POTASSIUM: 4.2 meq/L (ref 3.7–5.3)
Sodium: 137 mEq/L (ref 137–147)
Total Protein: 6.6 g/dL (ref 6.0–8.3)

## 2013-07-18 MED ORDER — OXYCODONE-ACETAMINOPHEN 5-325 MG PO TABS
1.0000 | ORAL_TABLET | ORAL | Status: DC | PRN
  Administered 2013-07-18: 1 via ORAL
  Filled 2013-07-18: qty 1

## 2013-07-18 MED ORDER — LIDOCAINE HCL (PF) 1 % IJ SOLN: 30.0000 mL | INTRAMUSCULAR | Status: DC | PRN

## 2013-07-18 MED ORDER — CITRIC ACID-SODIUM CITRATE 334-500 MG/5ML PO SOLN
30.0000 mL | ORAL | Status: DC | PRN
  Administered 2013-07-19 (×2): 30 mL via ORAL
  Filled 2013-07-18 (×2): qty 15

## 2013-07-18 MED ORDER — ACETAMINOPHEN 325 MG PO TABS: 650.0000 mg | ORAL_TABLET | ORAL | Status: DC | PRN

## 2013-07-18 MED ORDER — OXYTOCIN 40 UNITS IN LACTATED RINGERS INFUSION - SIMPLE MED: 62.5000 mL/h | INTRAVENOUS | Status: DC

## 2013-07-18 MED ORDER — LABETALOL HCL 300 MG PO TABS
300.0000 mg | ORAL_TABLET | Freq: Three times a day (TID) | ORAL | Status: DC
  Administered 2013-07-18 – 2013-07-21 (×7): 300 mg via ORAL
  Filled 2013-07-18 (×12): qty 1

## 2013-07-18 MED ORDER — LACTATED RINGERS IV SOLN
INTRAVENOUS | Status: DC
  Administered 2013-07-18 – 2013-07-19 (×2): via INTRAVENOUS

## 2013-07-18 MED ORDER — MISOPROSTOL 25 MCG QUARTER TABLET
25.0000 ug | ORAL_TABLET | ORAL | Status: DC | PRN
  Administered 2013-07-18 – 2013-07-19 (×2): 25 ug via VAGINAL
  Filled 2013-07-18 (×3): qty 0.25

## 2013-07-18 MED ORDER — OXYTOCIN BOLUS FROM INFUSION: 500.0000 mL | INTRAVENOUS | Status: DC

## 2013-07-18 MED ORDER — LACTATED RINGERS IV SOLN: 500.0000 mL | INTRAVENOUS | Status: DC | PRN

## 2013-07-18 MED ORDER — ONDANSETRON HCL 4 MG/2ML IJ SOLN
4.0000 mg | Freq: Four times a day (QID) | INTRAMUSCULAR | Status: DC | PRN
  Administered 2013-07-19: 4 mg via INTRAVENOUS
  Filled 2013-07-18: qty 2

## 2013-07-18 MED ORDER — TERBUTALINE SULFATE 1 MG/ML IJ SOLN: 0.2500 mg | Freq: Once | INTRAMUSCULAR | Status: AC | PRN

## 2013-07-18 MED ORDER — BUTORPHANOL TARTRATE 1 MG/ML IJ SOLN
1.0000 mg | INTRAMUSCULAR | Status: DC | PRN
  Administered 2013-07-19 (×2): 1 mg via INTRAVENOUS
  Filled 2013-07-18 (×2): qty 1

## 2013-07-18 NOTE — Telephone Encounter (Signed)
Preadmission screen  

## 2013-07-18 NOTE — H&P (Signed)
Bridget Fields is a 43 y.o. female presenting for induction of labor at 42 weeks for severe uncontrolled chronic hypertension possible superimposed preeclampsia.  Maternal Medical History:  Reason for admission: 43 yo [id number] at 34 weeks admitted as advised by Maternal Fetal Medicine for induction of labor due to severe uncontrolled chronic hypertension possible superimposed preeclampsia. Patient w/o complaints. No headaches, no blurry vision, no RUQ pain. She feels good fetal movements, no vaginal bleeding no contractions.  Fetal activity: Perceived fetal activity is normal.   Last perceived fetal movement was within the past 12 hours.    Prenatal complications: PIH and pre-eclampsia.   Prenatal Complications - Diabetes: none.    OB History   Grav Para Term Preterm Abortions TAB SAB Ect Mult Living   3 1 0 1 1  1   1      Past Medical History  Diagnosis Date  . Hypertension   . Miscarriage   . Abnormal Pap smear 1992 2014    1992 Leep, 02/2012 ASCUS HPV neg  . HPV in female    Past Surgical History  Procedure Laterality Date  . Knee arthrocentesis  plate and screws  . Cervical biopsy  w/ loop electrode excision     Family History: family history includes Breast cancer (age of onset: 41) in her mother; Diabetes in her mother; Heart disease in her father; Hypertension in her maternal grandfather, maternal grandmother, and mother. Social History:  reports that she has quit smoking. She does not have any smokeless tobacco history on file. She reports that she does not drink alcohol or use illicit drugs.   Prenatal Transfer Tool  Maternal Diabetes: No Genetic Screening: Normal Maternal Ultrasounds/Referrals: Normal Fetal Ultrasounds or other Referrals:  Referred to Materal Fetal Medicine  Maternal Substance Abuse:  No Significant Maternal Medications:  Meds include: Other: Labetalol Significant Maternal Lab Results:  Lab values include: Group B Strep negative history of elevated  AST Other Comments:  None  Review of Systems  Constitutional: Negative for fever.  Eyes: Negative for blurred vision.  Respiratory: Negative for cough.   Gastrointestinal: Negative for heartburn.  Genitourinary: Negative for dysuria.  Musculoskeletal: Negative for myalgias.  Skin: Negative for rash.  Neurological: Negative for dizziness and headaches.  Endo/Heme/Allergies: Does not bruise/bleed easily.  All other systems reviewed and are negative.     Temperature 98 F (36.7 C), temperature source Oral, resp. rate 20, last menstrual period 11/23/2012. Maternal Exam:  Uterine Assessment: Contraction frequency is rare.   Abdomen: Patient reports no abdominal tenderness. Fundal height is 34 cm.   Estimated fetal weight is 2600.   Fetal presentation: vertex  Introitus: Normal vulva. Normal vagina.  Ferning test: not done.  Nitrazine test: not done. Amniotic fluid character: not assessed.  Pelvis: adequate for delivery.   Cervix: Cervix evaluated by digital exam.   At last office exam: 1/40/-3  Physical Exam  Constitutional: She is oriented to person, place, and time. She appears well-developed and well-nourished.  Cardiovascular: Normal rate and regular rhythm.   Respiratory: Effort normal.  GI: Soft.  Neurological: She is alert and oriented to person, place, and time. She has normal reflexes.    Prenatal labs: ABO, Rh: --/--/O POS (05/18 0535) Antibody: NEG (05/18 0535) Rubella: Immune (01/16 0000) RPR: Nonreactive (01/16 0000)  HBsAg: Negative (01/16 0000)  HIV: Non-reactive (01/16 0000)  GBS: Negative (06/12 0000)   Assessment/Plan: SIUP at 34 weeks with severe chronic hypertension recommendation for delivery by MFM Will draw CMP to evaluate  LFTs with CBC Close monitoring of BP Labetalol IV prn severe range BP Induction of labor with misoprostol then pitocin for augmentation Peds to be present at delivery GBS negative   Athleen Feltner STACIA 07/18/2013, 8:22  PM

## 2013-07-19 ENCOUNTER — Inpatient Hospital Stay (HOSPITAL_COMMUNITY): Admission: RE | Admit: 2013-07-19 | Payer: Commercial Managed Care - PPO | Source: Ambulatory Visit

## 2013-07-19 ENCOUNTER — Encounter (HOSPITAL_COMMUNITY): Payer: Commercial Managed Care - PPO | Admitting: Anesthesiology

## 2013-07-19 ENCOUNTER — Encounter (HOSPITAL_COMMUNITY)
Admission: AD | Disposition: A | Discharge status: Home / Self Care | Payer: Self-pay | Source: Ambulatory Visit | Attending: Obstetrics & Gynecology

## 2013-07-19 ENCOUNTER — Encounter (HOSPITAL_COMMUNITY): Payer: Self-pay | Admitting: Anesthesiology

## 2013-07-19 ENCOUNTER — Inpatient Hospital Stay (HOSPITAL_COMMUNITY): Payer: Commercial Managed Care - PPO | Admitting: Anesthesiology

## 2013-07-19 LAB — CBC
HCT: 34.5 % — ABNORMAL LOW (ref 36.0–46.0)
HEMOGLOBIN: 11.7 g/dL — AB (ref 12.0–15.0)
MCH: 31.5 pg (ref 26.0–34.0)
MCHC: 33.9 g/dL (ref 30.0–36.0)
MCV: 93 fL (ref 78.0–100.0)
Platelets: 297 10*3/uL (ref 150–400)
RBC: 3.71 MIL/uL — ABNORMAL LOW (ref 3.87–5.11)
RDW: 14.3 % (ref 11.5–15.5)
WBC: 11.6 10*3/uL — AB (ref 4.0–10.5)

## 2013-07-19 LAB — RPR

## 2013-07-19 LAB — TYPE AND SCREEN
ABO/RH(D): O POS
Antibody Screen: NEGATIVE

## 2013-07-19 SURGERY — Surgical Case
Anesthesia: Epidural

## 2013-07-19 MED ORDER — MORPHINE SULFATE (PF) 0.5 MG/ML IJ SOLN
INTRAMUSCULAR | Status: DC | PRN
  Administered 2013-07-19: 3 mg via EPIDURAL

## 2013-07-19 MED ORDER — IBUPROFEN 600 MG PO TABS
600.0000 mg | ORAL_TABLET | Freq: Four times a day (QID) | ORAL | Status: DC
  Filled 2013-07-19 (×4): qty 1

## 2013-07-19 MED ORDER — DIBUCAINE 1 % RE OINT: 1.0000 "application " | TOPICAL_OINTMENT | RECTAL | Status: DC | PRN

## 2013-07-19 MED ORDER — SIMETHICONE 80 MG PO CHEW: 80.0000 mg | CHEWABLE_TABLET | ORAL | Status: DC | PRN

## 2013-07-19 MED ORDER — OXYCODONE-ACETAMINOPHEN 5-325 MG PO TABS
1.0000 | ORAL_TABLET | ORAL | Status: DC | PRN
  Administered 2013-07-20: 1 via ORAL
  Filled 2013-07-19: qty 1

## 2013-07-19 MED ORDER — SODIUM BICARBONATE 8.4 % IV SOLN
INTRAVENOUS | Status: AC
  Filled 2013-07-19: qty 50

## 2013-07-19 MED ORDER — SODIUM BICARBONATE 8.4 % IV SOLN
INTRAVENOUS | Status: DC | PRN
  Administered 2013-07-19: 5 mL via EPIDURAL
  Administered 2013-07-19: 10 mL via EPIDURAL
  Administered 2013-07-19: 5 mL via EPIDURAL

## 2013-07-19 MED ORDER — NALOXONE HCL 1 MG/ML IJ SOLN: 1.0000 ug/kg/h | INTRAVENOUS | Status: DC | PRN

## 2013-07-19 MED ORDER — LACTATED RINGERS IV SOLN
INTRAVENOUS | Status: DC | PRN
  Administered 2013-07-19 (×2): via INTRAVENOUS

## 2013-07-19 MED ORDER — TERBUTALINE SULFATE 1 MG/ML IJ SOLN: 0.2500 mg | Freq: Once | INTRAMUSCULAR | Status: DC | PRN

## 2013-07-19 MED ORDER — 0.9 % SODIUM CHLORIDE (POUR BTL) OPTIME
TOPICAL | Status: DC | PRN
  Administered 2013-07-19: 1000 mL

## 2013-07-19 MED ORDER — KETOROLAC TROMETHAMINE 30 MG/ML IJ SOLN: 30.0000 mg | Freq: Four times a day (QID) | INTRAMUSCULAR | Status: AC | PRN

## 2013-07-19 MED ORDER — DIPHENHYDRAMINE HCL 50 MG/ML IJ SOLN: 12.5000 mg | INTRAMUSCULAR | Status: DC | PRN

## 2013-07-19 MED ORDER — OXYTOCIN 40 UNITS IN LACTATED RINGERS INFUSION - SIMPLE MED: 62.5000 mL/h | INTRAVENOUS | Status: AC

## 2013-07-19 MED ORDER — MAGNESIUM SULFATE 40 G IN LACTATED RINGERS - SIMPLE
2.0000 g/h | INTRAVENOUS | Status: DC
  Filled 2013-07-19: qty 500

## 2013-07-19 MED ORDER — MORPHINE SULFATE 0.5 MG/ML IJ SOLN
INTRAMUSCULAR | Status: AC
  Filled 2013-07-19: qty 10

## 2013-07-19 MED ORDER — LABETALOL HCL 5 MG/ML IV SOLN
10.0000 mg | Freq: Once | INTRAVENOUS | Status: AC
  Administered 2013-07-19: 10 mg via INTRAVENOUS

## 2013-07-19 MED ORDER — NALBUPHINE HCL 10 MG/ML IJ SOLN: 5.0000 mg | INTRAMUSCULAR | Status: DC | PRN

## 2013-07-19 MED ORDER — LABETALOL HCL 5 MG/ML IV SOLN
INTRAVENOUS | Status: AC
  Administered 2013-07-19: 10 mg via INTRAVENOUS
  Filled 2013-07-19: qty 4

## 2013-07-19 MED ORDER — SODIUM CHLORIDE 0.9 % IJ SOLN: 3.0000 mL | INTRAMUSCULAR | Status: DC | PRN

## 2013-07-19 MED ORDER — LIDOCAINE-EPINEPHRINE (PF) 2 %-1:200000 IJ SOLN
INTRAMUSCULAR | Status: AC
  Filled 2013-07-19: qty 20

## 2013-07-19 MED ORDER — SENNOSIDES-DOCUSATE SODIUM 8.6-50 MG PO TABS
2.0000 | ORAL_TABLET | ORAL | Status: DC
  Administered 2013-07-20 (×2): 2 via ORAL
  Filled 2013-07-19 (×2): qty 2

## 2013-07-19 MED ORDER — LABETALOL HCL 5 MG/ML IV SOLN: 10.0000 mg | Freq: Once | INTRAVENOUS | Status: DC

## 2013-07-19 MED ORDER — LACTATED RINGERS IV SOLN
500.0000 mL | Freq: Once | INTRAVENOUS | Status: AC
  Administered 2013-07-19: 500 mL via INTRAVENOUS

## 2013-07-19 MED ORDER — HYDROMORPHONE HCL PF 1 MG/ML IJ SOLN
INTRAMUSCULAR | Status: AC
  Filled 2013-07-19: qty 1

## 2013-07-19 MED ORDER — DIPHENHYDRAMINE HCL 25 MG PO CAPS: 25.0000 mg | ORAL_CAPSULE | Freq: Four times a day (QID) | ORAL | Status: DC | PRN

## 2013-07-19 MED ORDER — EPHEDRINE 5 MG/ML INJ
10.0000 mg | INTRAVENOUS | Status: DC | PRN
  Filled 2013-07-19: qty 4

## 2013-07-19 MED ORDER — ONDANSETRON HCL 4 MG PO TABS: 4.0000 mg | ORAL_TABLET | ORAL | Status: DC | PRN

## 2013-07-19 MED ORDER — ONDANSETRON HCL 4 MG/2ML IJ SOLN
INTRAMUSCULAR | Status: AC
  Filled 2013-07-19: qty 2

## 2013-07-19 MED ORDER — FENTANYL 2.5 MCG/ML BUPIVACAINE 1/10 % EPIDURAL INFUSION (WH - ANES)
INTRAMUSCULAR | Status: DC | PRN
  Administered 2013-07-19: 14 mL/h via EPIDURAL

## 2013-07-19 MED ORDER — LIDOCAINE HCL (PF) 1 % IJ SOLN
INTRAMUSCULAR | Status: DC | PRN
  Administered 2013-07-19: 8 mL
  Administered 2013-07-19: 9 mL

## 2013-07-19 MED ORDER — ONDANSETRON HCL 4 MG/2ML IJ SOLN: 4.0000 mg | Freq: Three times a day (TID) | INTRAMUSCULAR | Status: DC | PRN

## 2013-07-19 MED ORDER — METOCLOPRAMIDE HCL 5 MG/ML IJ SOLN: 10.0000 mg | Freq: Three times a day (TID) | INTRAMUSCULAR | Status: DC | PRN

## 2013-07-19 MED ORDER — MORPHINE SULFATE (PF) 0.5 MG/ML IJ SOLN
INTRAMUSCULAR | Status: DC | PRN
  Administered 2013-07-19: 2 mg via INTRAVENOUS

## 2013-07-19 MED ORDER — MAGNESIUM SULFATE BOLUS VIA INFUSION
4.0000 g | Freq: Once | INTRAVENOUS | Status: DC
  Filled 2013-07-19: qty 500

## 2013-07-19 MED ORDER — SIMETHICONE 80 MG PO CHEW
80.0000 mg | CHEWABLE_TABLET | Freq: Three times a day (TID) | ORAL | Status: DC
  Filled 2013-07-19 (×4): qty 1

## 2013-07-19 MED ORDER — SCOPOLAMINE 1 MG/3DAYS TD PT72
1.0000 | MEDICATED_PATCH | Freq: Once | TRANSDERMAL | Status: DC
  Administered 2013-07-19: 1.5 mg via TRANSDERMAL

## 2013-07-19 MED ORDER — HYDROMORPHONE HCL PF 1 MG/ML IJ SOLN
0.2500 mg | INTRAMUSCULAR | Status: DC | PRN
  Administered 2013-07-19: 0.5 mg via INTRAVENOUS

## 2013-07-19 MED ORDER — MEPERIDINE HCL 25 MG/ML IJ SOLN: 6.2500 mg | INTRAMUSCULAR | Status: DC | PRN

## 2013-07-19 MED ORDER — DIPHENHYDRAMINE HCL 25 MG PO CAPS: 25.0000 mg | ORAL_CAPSULE | ORAL | Status: DC | PRN

## 2013-07-19 MED ORDER — ONDANSETRON HCL 4 MG/2ML IJ SOLN
INTRAMUSCULAR | Status: DC | PRN
  Administered 2013-07-19: 4 mg via INTRAVENOUS

## 2013-07-19 MED ORDER — TETANUS-DIPHTH-ACELL PERTUSSIS 5-2.5-18.5 LF-MCG/0.5 IM SUSP
0.5000 mL | Freq: Once | INTRAMUSCULAR | Status: AC
  Administered 2013-07-20: 0.5 mL via INTRAMUSCULAR
  Filled 2013-07-19: qty 0.5

## 2013-07-19 MED ORDER — FENTANYL 2.5 MCG/ML BUPIVACAINE 1/10 % EPIDURAL INFUSION (WH - ANES)
14.0000 mL/h | INTRAMUSCULAR | Status: DC | PRN
  Administered 2013-07-19: 14 mL/h via EPIDURAL
  Filled 2013-07-19: qty 125

## 2013-07-19 MED ORDER — MENTHOL 3 MG MT LOZG: 1.0000 | LOZENGE | OROMUCOSAL | Status: DC | PRN

## 2013-07-19 MED ORDER — DIPHENHYDRAMINE HCL 50 MG/ML IJ SOLN: 25.0000 mg | INTRAMUSCULAR | Status: DC | PRN

## 2013-07-19 MED ORDER — WITCH HAZEL-GLYCERIN EX PADS: 1.0000 "application " | MEDICATED_PAD | CUTANEOUS | Status: DC | PRN

## 2013-07-19 MED ORDER — ONDANSETRON HCL 4 MG/2ML IJ SOLN: 4.0000 mg | INTRAMUSCULAR | Status: DC | PRN

## 2013-07-19 MED ORDER — PHENYLEPHRINE 40 MCG/ML (10ML) SYRINGE FOR IV PUSH (FOR BLOOD PRESSURE SUPPORT)
80.0000 ug | PREFILLED_SYRINGE | INTRAVENOUS | Status: DC | PRN
  Filled 2013-07-19: qty 10

## 2013-07-19 MED ORDER — LANOLIN HYDROUS EX OINT: 1.0000 "application " | TOPICAL_OINTMENT | CUTANEOUS | Status: DC | PRN

## 2013-07-19 MED ORDER — OXYTOCIN 10 UNIT/ML IJ SOLN
INTRAMUSCULAR | Status: AC
  Filled 2013-07-19: qty 4

## 2013-07-19 MED ORDER — CEFAZOLIN SODIUM-DEXTROSE 2-3 GM-% IV SOLR
2.0000 g | Freq: Once | INTRAVENOUS | Status: AC
  Administered 2013-07-19: 2 g via INTRAVENOUS
  Filled 2013-07-19: qty 50

## 2013-07-19 MED ORDER — KETOROLAC TROMETHAMINE 30 MG/ML IJ SOLN
INTRAMUSCULAR | Status: AC
  Administered 2013-07-19: 30 mg via INTRAVENOUS
  Filled 2013-07-19: qty 1

## 2013-07-19 MED ORDER — EPHEDRINE 5 MG/ML INJ: 10.0000 mg | INTRAVENOUS | Status: DC | PRN

## 2013-07-19 MED ORDER — PHENYLEPHRINE 40 MCG/ML (10ML) SYRINGE FOR IV PUSH (FOR BLOOD PRESSURE SUPPORT): 80.0000 ug | PREFILLED_SYRINGE | INTRAVENOUS | Status: DC | PRN

## 2013-07-19 MED ORDER — OXYTOCIN 40 UNITS IN LACTATED RINGERS INFUSION - SIMPLE MED: 1.0000 m[IU]/min | INTRAVENOUS | Status: DC

## 2013-07-19 MED ORDER — LABETALOL HCL 5 MG/ML IV SOLN
10.0000 mg | Freq: Once | INTRAVENOUS | Status: AC
  Administered 2013-07-19 (×2): 10 mg via INTRAVENOUS

## 2013-07-19 MED ORDER — SCOPOLAMINE 1 MG/3DAYS TD PT72
MEDICATED_PATCH | TRANSDERMAL | Status: AC
  Filled 2013-07-19: qty 1

## 2013-07-19 MED ORDER — MAGNESIUM SULFATE BOLUS VIA INFUSION
4.0000 g | Freq: Once | INTRAVENOUS | Status: AC
  Administered 2013-07-19: 4 g via INTRAVENOUS
  Filled 2013-07-19: qty 500

## 2013-07-19 MED ORDER — NALOXONE HCL 0.4 MG/ML IJ SOLN
0.4000 mg | INTRAMUSCULAR | Status: DC | PRN
Start: 2013-07-19 — End: 2013-07-21

## 2013-07-19 MED ORDER — SIMETHICONE 80 MG PO CHEW
80.0000 mg | CHEWABLE_TABLET | ORAL | Status: DC
  Administered 2013-07-20: 80 mg via ORAL
  Filled 2013-07-19: qty 1

## 2013-07-19 MED ORDER — ZOLPIDEM TARTRATE 5 MG PO TABS: 5.0000 mg | ORAL_TABLET | Freq: Every evening | ORAL | Status: DC | PRN

## 2013-07-19 MED ORDER — OXYTOCIN 10 UNIT/ML IJ SOLN
40.0000 [IU] | INTRAMUSCULAR | Status: DC | PRN
  Administered 2013-07-19: 40 [IU] via INTRAVENOUS

## 2013-07-19 MED ORDER — LACTATED RINGERS IV SOLN
INTRAVENOUS | Status: DC
  Administered 2013-07-19 – 2013-07-20 (×2): via INTRAVENOUS

## 2013-07-19 MED ORDER — KETOROLAC TROMETHAMINE 30 MG/ML IJ SOLN
30.0000 mg | Freq: Four times a day (QID) | INTRAMUSCULAR | Status: AC | PRN
  Administered 2013-07-19: 30 mg via INTRAVENOUS

## 2013-07-19 MED ORDER — PRENATAL MULTIVITAMIN CH
1.0000 | ORAL_TABLET | Freq: Every day | ORAL | Status: DC
  Filled 2013-07-19: qty 1

## 2013-07-19 SURGICAL SUPPLY — 31 items
CLAMP CORD UMBIL (MISCELLANEOUS) IMPLANT
CLIP FILSHIE TUBAL LIGA STRL (Clip) ×2 IMPLANT
CLOTH BEACON ORANGE TIMEOUT ST (SAFETY) ×2 IMPLANT
DRAPE LG THREE QUARTER DISP (DRAPES) ×2 IMPLANT
DRSG OPSITE POSTOP 4X10 (GAUZE/BANDAGES/DRESSINGS) ×2 IMPLANT
DURAPREP 26ML APPLICATOR (WOUND CARE) ×2 IMPLANT
ELECT REM PT RETURN 9FT ADLT (ELECTROSURGICAL) ×2
ELECTRODE REM PT RTRN 9FT ADLT (ELECTROSURGICAL) ×1 IMPLANT
EXTRACTOR VACUUM M CUP 4 TUBE (SUCTIONS) IMPLANT
GLOVE BIO SURGEON STRL SZ 6.5 (GLOVE) ×2 IMPLANT
GLOVE BIOGEL M 7.0 STRL (GLOVE) ×2 IMPLANT
GLOVE BIOGEL PI IND STRL 6.5 (GLOVE) ×1 IMPLANT
GLOVE BIOGEL PI INDICATOR 6.5 (GLOVE) ×1
GOWN STRL REUS W/TWL LRG LVL3 (GOWN DISPOSABLE) ×4 IMPLANT
KIT ABG SYR 3ML LUER SLIP (SYRINGE) IMPLANT
NEEDLE HYPO 25X5/8 SAFETYGLIDE (NEEDLE) IMPLANT
NS IRRIG 1000ML POUR BTL (IV SOLUTION) ×2 IMPLANT
PACK C SECTION WH (CUSTOM PROCEDURE TRAY) ×2 IMPLANT
PAD OB MATERNITY 4.3X12.25 (PERSONAL CARE ITEMS) ×2 IMPLANT
RTRCTR C-SECT PINK 25CM LRG (MISCELLANEOUS) ×2 IMPLANT
STAPLER VISISTAT 35W (STAPLE) ×2 IMPLANT
SUT MON AB 2-0 CT1 27 (SUTURE) ×2 IMPLANT
SUT MON AB 4-0 PS1 27 (SUTURE) IMPLANT
SUT PDS AB 0 CTX 60 (SUTURE) IMPLANT
SUT PLAIN 2 0 XLH (SUTURE) IMPLANT
SUT VIC AB 0 CTX 36 (SUTURE) ×4
SUT VIC AB 0 CTX36XBRD ANBCTRL (SUTURE) ×4 IMPLANT
SUT VIC AB 4-0 KS 27 (SUTURE) IMPLANT
TOWEL OR 17X24 6PK STRL BLUE (TOWEL DISPOSABLE) ×2 IMPLANT
TRAY FOLEY CATH 14FR (SET/KITS/TRAYS/PACK) ×2 IMPLANT
WATER STERILE IRR 1000ML POUR (IV SOLUTION) IMPLANT

## 2013-07-19 NOTE — Progress Notes (Signed)
c-section discussed with risks and benefits, pt verbalized understanding and gave consent.  Prep for OR. Anesthesia and NICU notified

## 2013-07-19 NOTE — Transfer of Care (Signed)
Immediate Anesthesia Transfer of Care Note  Patient: Bridget Fields  Procedure(s) Performed: Procedure(s): CESAREAN SECTION (N/A)  Patient Location: PACU  Anesthesia Type:Epidural  Level of Consciousness: awake, alert  and oriented  Airway & Oxygen Therapy: Patient Spontanous Breathing  Post-op Assessment: Report given to PACU RN and Post -op Vital signs reviewed and stable  Post vital signs: Reviewed and stable  Complications: No apparent anesthesia complications

## 2013-07-19 NOTE — Anesthesia Postprocedure Evaluation (Signed)
  Anesthesia Post-op Note  Patient: Bridget Fields  Procedure(s) Performed: Procedure(s): CESAREAN SECTION (N/A)  Patient Location: ICU  Anesthesia Type:Spinal  Level of Consciousness: awake, alert  and oriented  Airway and Oxygen Therapy: Patient Spontanous Breathing  Post-op Pain: none  Post-op Assessment: Post-op Vital signs reviewed, Patient's Cardiovascular Status Stable, Respiratory Function Stable, No headache, No backache, No residual numbness and No residual motor weakness  Post-op Vital Signs: Reviewed and stable  Last Vitals:  Filed Vitals:   07/19/13 1600  BP: 177/109  Pulse:   Temp:   Resp: 22    Complications: No apparent anesthesia complications

## 2013-07-19 NOTE — Progress Notes (Signed)
07/19/13 1600  Vitals  BP ! 177/109 mmHg  MAP (mmHg) 125  Steward Sedation Scale  Consciousness 2  Airway 2  Movement 2  Total Score 6  pt visibly upset and crying after talking with office staff about her insurance coverage.  Emotional support provided.

## 2013-07-19 NOTE — Anesthesia Procedure Notes (Signed)
Epidural Patient location during procedure: OB Start time: 07/19/2013 6:18 AM End time: 07/19/2013 6:22 AM  Staffing Anesthesiologist: Lyn Hollingshead Performed by: anesthesiologist   Preanesthetic Checklist Completed: patient identified, surgical consent, pre-op evaluation, timeout performed, IV checked, risks and benefits discussed and monitors and equipment checked  Epidural Patient position: sitting Prep: site prepped and draped and DuraPrep Patient monitoring: continuous pulse ox and blood pressure Approach: midline Location: L3-L4 Injection technique: LOR air  Needle:  Needle type: Tuohy  Needle gauge: 17 G Needle length: 9 cm and 9 Needle insertion depth: 7 cm Catheter type: closed end flexible Catheter size: 19 Gauge Catheter at skin depth: 12 cm Test dose: negative and Other  Assessment Sensory level: T9 Events: blood not aspirated, injection not painful, no injection resistance, negative IV test and no paresthesia  Additional Notes Reason for block:procedure for pain

## 2013-07-19 NOTE — Brief Op Note (Signed)
07/18/2013 - 07/19/2013  9:55 AM  PATIENT:  Bridget Fields  43 y.o. female  PRE-OPERATIVE DIAGNOSIS:  NRFHT  POST-OPERATIVE DIAGNOSIS:  NRFHT  PROCEDURE:  Procedure(s): CESAREAN SECTION (N/A)  SURGEON:  Surgeon(s) and Role:    Allyn Kenner, DO - Primary  ASSISTANTS: none   ANESTHESIA:   epidural  EBL:  Total I/O In: 300 [I.V.:300] Out: 415 [Urine:15; Blood:400]  BLOOD ADMINISTERED:none  SPECIMEN:  Source of Specimen:  cord blood, cord pH  DISPOSITION OF SPECIMEN:  PATHOLOGY  COUNTS:  YES  PLAN OF CARE: Admit to inpatient   PATIENT DISPOSITION:  PACU - hemodynamically stable.   Delay start of Pharmacological VTE agent (>24hrs) due to surgical blood loss or risk of bleeding: not applicable

## 2013-07-19 NOTE — Progress Notes (Signed)
Pt is comfortable s/p epidural about 6am. Since epidural placement pt has had persistent decelerations to 90-120.  TOCO has been difficult to monitor, some earlier decels looked late.  Pt's cervix is 1/50/-3.  Now FHT with minimal variability.  Discussed with patient and agreed to proceed to c/s for NRFHT.  Pt desires BTL at the same time. Severe and mild range BPs, will start Magnesium sulfate after delivery of infant.

## 2013-07-19 NOTE — Progress Notes (Signed)
UR chart review completed.  

## 2013-07-19 NOTE — Op Note (Signed)
NAMEPHILLIPA, Bridget Fields                 ACCOUNT NO.:  [id number]  MEDICAL RECORD NO.:  [id number]  LOCATION:  3762                          FACILITY:  [facility]  PHYSICIAN:  Allyn Kenner, DO    DATE OF BIRTH:  1970/03/22  DATE OF PROCEDURE:  07/19/2013 DATE OF DISCHARGE:                              OPERATIVE REPORT   PREOPERATIVE DIAGNOSES:  A 34-week intrauterine pregnancy, nonreassuring fetal heart tones.  POSTOPERATIVE DIAGNOSES:  A 34-week intrauterine pregnancy, nonreassuring fetal heart tones.  PROCEDURE:  Low-transverse cesarean incision with bilateral tubal ligation.  SURGEON:  Allyn Kenner, DO  ANESTHESIA:  Epidural.  IV FLUIDS:  300 mL.  URINE OUTPUT:  15 mL.  BLOOD LOSS:  400 mL.  PREOPERATIVE ANTIBIOTICS:  2 g of Ancef.  FINDINGS:  Female infant in cephalic presentation with Apgars of 3 and 8, weight pending.  Normal-appearing ovaries and tubes bilaterally, and clear amniotic fluid.  COMPLICATIONS:  None.  CONDITION:  Stable to PACU.  DESCRIPTION OF PROCEDURE:  The patient was taken to the operating room, where epidural anesthesia was found to be adequate.  She was prepped and draped in the normal sterile fashion in dorsal supine position with a leftward tilt.  A Pfannenstiel skin incision was made with the scalpel and carried down to underlying layer of fascia with Bovie cautery.  The fascia was incised at the midline with the scalpel and extended laterally with Mayo scissors.  Kocher clamps were placed at the superior aspect of the fascial incision, tented and rectus muscle was dissected off bluntly and sharply.  Kocher clamps were then placed at the inferior aspect of the fascial incision and again, the rectus muscles were dissected off bluntly and sharply.  Rectus muscles were separated bluntly, subcutaneous tissue entered bluntly with good visualization of peritoneum, which was tented with hemostats and entered sharply with no underlying structures  present.  The peritoneal incision was extended by manual traction laterally.  The abdomen was manually surveyed, and no abnormalities noted except a small anterior pedunculated fibroid.  The Alexis self retractor was placed and bladder flap created sharply and developed manually.  Scalpel was used to make a low-transverse cesarean incision.  Amniotic sac was entered sharply and extended with cephalic and caudal traction.  Infant's head was located, elevated from the pelvis and delivered without difficulty with gentle fundal pressure followed by the remainder of its body.  Cord was noted, wrapped around its shoulders and posterior neck, but did not surround the neck.  The cord was clamped and cut, and the infant was handed off to awaiting neonatology.  Cord blood was collected and a umbilical cord pH was also collected and sent to the lab.  The placenta was delivered by gentle traction on the umbilical cord and external massage of the uterus.  The uterine cavity was cleared of all clot and debris, and the uterine incision was closed with 0 Vicryl in a running, locked fashion.  One additional figure-of-eight was placed along the right side of the incision with excellent hemostasis noted.  The right fallopian tube was identified, grasped with Kary Kos and followed out its fimbriated end. Filshie clip was placed surround the entire  tube.  Ovary was normal in size.  The first tube was on the left was located and grasped with a Babcock and followed out to its fimbriated end and a Filshie clip was applied to that side noting to encompass the entire tube.  Ovary was normal on that side.  The uterine incision was reexamined and found to be hemostatic.  The Alexis self retractor was removed and again the incision was visualized one more time and found to be hemostatic. Peritoneum on the left side was somewhat tight with placenta being verified at the other side of the peritoneum; therefore,  peritoneal incision was not closed.  Uterine incision was observed and found to be hemostatic.  The fascia was reapproximated and closed with Vicryl in a running fashion.  The subcutaneous tissue was irrigated, dried, and Bovie cautery was used to obtain excellent hemostasis.  The skin was reapproximated and closed with staples.  Sponge, lap, and needle counts were correct x2.  The patient tolerated the procedure well.  The patient was taken to recovery in stable condition.          ______________________________ Allyn Kenner, DO     [place]/MED[facility]  D:  07/19/2013  T:  07/19/2013  Job:  [id number]

## 2013-07-19 NOTE — Addendum Note (Signed)
Addendum created 07/19/13 1647 by Jonna Munro, CRNA   Modules edited: Notes Section   Notes Section:  File: [id number]

## 2013-07-19 NOTE — Anesthesia Preprocedure Evaluation (Signed)
Anesthesia Evaluation  Patient identified by MRN, date of birth, ID band Patient awake    Reviewed: Allergy & Precautions, H&P , NPO status , Patient's Chart, lab work & pertinent test results  Airway Mallampati: II  TM Distance: >3 FB Neck ROM: full    Dental no notable dental hx.    Pulmonary former smoker,    Pulmonary exam normal       Cardiovascular hypertension, Pt. on medications     Neuro/Psych negative neurological ROS  negative psych ROS   GI/Hepatic negative GI ROS, Neg liver ROS,   Endo/Other  Morbid obesity  Renal/GU negative Renal ROS     Musculoskeletal   Abdominal (+) + obese,   Peds  Hematology negative hematology ROS (+)   Anesthesia Other Findings   Reproductive/Obstetrics (+) Pregnancy                             Anesthesia Physical Anesthesia Plan  ASA: III  Anesthesia Plan: Epidural   Post-op Pain Management:    Induction:   Airway Management Planned:   Additional Equipment:   Intra-op Plan:   Post-operative Plan:   Informed Consent: I have reviewed the patients History and Physical, chart, labs and discussed the procedure including the risks, benefits and alternatives for the proposed anesthesia with the patient or authorized representative who has indicated his/her understanding and acceptance.     Plan Discussed with:   Anesthesia Plan Comments:         Anesthesia Quick Evaluation  

## 2013-07-19 NOTE — Anesthesia Postprocedure Evaluation (Signed)
  Anesthesia Post Note  Patient: Bridget Fields  Procedure(s) Performed: Procedure(s) (LRB): CESAREAN SECTION (N/A)  Anesthesia type: Epidural  Patient location: PACU  Post pain: Pain level controlled  Post assessment: Post-op Vital signs reviewed  Last Vitals:  Filed Vitals:   07/19/13 0836  BP:   Pulse: 93  Temp:   Resp:     Post vital signs: Reviewed  Level of consciousness: awake  Complications: No apparent anesthesia complications

## 2013-07-19 NOTE — Lactation Note (Signed)
This note was copied from the chart of Bridget Markiyah Gahm. Lactation Consultation Note     Initial consult with this mom for a NICU baby, now 26 hours old, and 34 1/[redacted] weeks gestation. Mom is in [place]  and mom has been in antenatal unit for almost 2 months PTD. Mom wants to provide EBm for her baby, and breast feed, so I started her pumping with DEP, in premie setting, and taught her how to hand express. She deomstrated with good technique, but will need more pracitcceelp. Mom was able to express about 2-3 mls of colostrum. Mom was surprised and pleased. Mom will call her insurance companty for a DEP, and until then will need to loan a DEP from [facility] Mom knows to call for questions/concerns.  Patient Name: Bridget Fields EHMCN'O Date: 07/19/2013 Reason for consult: Initial assessment;NICU baby   Maternal Data Formula Feeding for Exclusion: Yes (mom in AICU and bab y in NICU) Reason for exclusion: Admission to Intensive Care Unit (ICU) post-partum Infant to breast within first hour of birth: No Breastfeeding delayed due to:: Infant status Has patient been taught Hand Expression?: Yes Does the patient have breastfeeding experience prior to this delivery?: No (mom has a 65 year old daughter)  Feeding    LATCH Score/Interventions                      Lactation Tools Discussed/Used Tools: Pump Breast pump type: Double-Electric Breast Pump WIC Program: No Pump Review: Setup, frequency, and cleaning;Milk Storage;Other (comment) (hand expression, premie setting, review of NICU booklet) Initiated by:: clee rn lc at 4 hours post aprtum Date initiated:: 07/19/13   Consult Status Consult Status: Follow-up Date: 07/20/13 Follow-up type: In-patient    Tonna Corner 07/19/2013, 2:54 PM

## 2013-07-20 ENCOUNTER — Encounter (HOSPITAL_COMMUNITY): Payer: Self-pay | Admitting: Obstetrics and Gynecology

## 2013-07-20 LAB — CBC
HEMATOCRIT: 34.4 % — AB (ref 36.0–46.0)
Hemoglobin: 11.2 g/dL — ABNORMAL LOW (ref 12.0–15.0)
MCH: 29.9 pg (ref 26.0–34.0)
MCHC: 32.6 g/dL (ref 30.0–36.0)
MCV: 92 fL (ref 78.0–100.0)
Platelets: 285 10*3/uL (ref 150–400)
RBC: 3.74 MIL/uL — AB (ref 3.87–5.11)
RDW: 14.5 % (ref 11.5–15.5)
WBC: 17.9 10*3/uL — AB (ref 4.0–10.5)

## 2013-07-20 NOTE — Discharge Summary (Signed)
Obstetric Discharge Summary Reason for Admission: Severe Gestational Hypertension Prenatal Procedures: NST and ultrasound Intrapartum Procedures: NA Postpartum Procedures: NA Complications-Operative and Postpartum: NA Hemoglobin  Date Value Ref Range Status  07/20/2013 11.2* 12.0 - 15.0 g/dL Final     HCT  Date Value Ref Range Status  07/20/2013 34.4* 36.0 - 46.0 % Final    Physical Exam:  General: alert, cooperative and appears stated age Abd: obese, gravid   Discharge Diagnoses: Severe gestational hypertension with resolved Liver function abnormality  Discharge Information: Date: 07/20/2013 Activity: Bedrest Diet: routine Medications: Labetalol Condition: stable Instructions: refer to practice specific booklet Discharge to: home   ROSS,KENDRA H. 07/20/2013, 10:43 AM

## 2013-07-20 NOTE — Progress Notes (Signed)
Subjective: Postpartum Day 1: Cesarean Delivery Patient reports feeling well, denies nausea or vomiting. Denies Headaches or vision changes. Pain controlled.     Objective: Vital signs in last 24 hours: Filed Vitals:   07/20/13 0551 07/20/13 0555 07/20/13 0715 07/20/13 0800  BP:  149/93 131/66 130/73  Pulse:  89 85 84  Temp:  98.1 F (36.7 C)  98.3 F (36.8 C)  TempSrc:    Oral  Resp:    16  Height:      Weight: 102.173 kg (225 lb 4 oz)     SpO2:  100% 98%     Physical Exam:  General: alert, cooperative and appears stated age Lochia: appropriate Uterine Fundus: firm Incision: healing well DVT Evaluation: No evidence of DVT seen on physical exam.   Recent Labs  07/19/13 1025 07/20/13 0548  HGB 11.7* 11.2*  HCT 34.5* 34.4*    Assessment/Plan: Status post Cesarean section. Doing well postoperatively.  Continue current care. Will transfer out of ICU Baby in NICU off O2 doing well  ROSS,KENDRA H. 07/20/2013, 9:32 AM

## 2013-07-20 NOTE — Progress Notes (Signed)
Pt transferred to women's unit. Pt ambulatory. I will continue to be her nurse.

## 2013-07-21 NOTE — Progress Notes (Signed)
Honeycomb dressing removed. Staples removed. Site is clean, dry, and intact. Steri strips applied.

## 2013-07-21 NOTE — Discharge Summary (Signed)
Obstetric Discharge Summary Reason for Admission: induction of labor Prenatal Procedures: NST, Preeclampsia and ultrasound Intrapartum Procedures: cesarean: low cervical, transverse Postpartum Procedures: none Complications-Operative and Postpartum: none Hemoglobin  Date Value Ref Range Status  07/20/2013 11.2* 12.0 - 15.0 g/dL Final     HCT  Date Value Ref Range Status  07/20/2013 34.4* 36.0 - 46.0 % Final    Physical Exam:  General: alert Lochia: appropriate Uterine Fundus: firm Incision: healing well DVT Evaluation: No evidence of DVT seen on physical exam.  Discharge Diagnoses: Preelampsia and chronic HTN and NRFHT  Discharge Information: Date: 07/21/2013 Activity: pelvic rest Diet: routine Medications: PNV and Ibuprofen and labetalol Condition: stable Instructions: refer to practice specific booklet Discharge to: home Follow-up Information   Follow up with Olga Millers, MD. Schedule an appointment as soon as possible for a visit in 2 weeks.   Specialty:  Obstetrics and Gynecology   Contact information:   [place] [id number] [phone]       Newborn Data: Live born female  Birth Weight: 4 lb 12.2 oz (2160 g) APGAR: 3, 8  Home with NICU.  ANDERSON,MARK E 07/21/2013, 8:55 AM

## 2013-07-21 NOTE — Progress Notes (Signed)
Discharge instructions complete and pt did not have any questions. Pt discharged home with family.

## 2013-07-21 NOTE — Progress Notes (Signed)
POD#2 Pt doing well . Would like to go home VSSAF IMP/ doing well Plan/will discharge.

## 2013-07-25 ENCOUNTER — Encounter (HOSPITAL_COMMUNITY): Payer: Self-pay

## 2013-09-12 ENCOUNTER — Telehealth: Payer: Self-pay | Admitting: Certified Nurse Midwife

## 2013-09-12 NOTE — Telephone Encounter (Signed)
Yes this is fine.

## 2013-09-12 NOTE — Telephone Encounter (Signed)
Pt was seen at [facility] she just had a baby June 2015 and was told to schedule a 1 month follow up with D.Leonard for a blood pressure check. I have scheduled her for an office visit appointment 10/13/13 with DL and just want to know if this is okay?

## 2013-10-13 ENCOUNTER — Encounter: Payer: Self-pay | Admitting: Certified Nurse Midwife

## 2013-10-13 ENCOUNTER — Ambulatory Visit (INDEPENDENT_AMBULATORY_CARE_PROVIDER_SITE_OTHER): Payer: Commercial Managed Care - PPO | Admitting: Certified Nurse Midwife

## 2013-10-13 VITALS — BP 142/92 | HR 72 | Ht 62.0 in | Wt 210.0 lb

## 2013-10-13 DIAGNOSIS — I10 Essential (primary) hypertension: Secondary | ICD-10-CM

## 2013-10-13 LAB — POTASSIUM: POTASSIUM: 4.1 meq/L (ref 3.5–5.3)

## 2013-10-13 MED ORDER — HYDROCHLOROTHIAZIDE 25 MG PO TABS: 25.0000 mg | ORAL_TABLET | Freq: Every day | ORAL | Status: DC

## 2013-10-13 NOTE — Patient Instructions (Signed)

## 2013-10-13 NOTE — Progress Notes (Signed)
43 y.o. Married [place] American female [phone] here for follow up of Hypertension treated with Labetalol during pregnancy. Daughter is now 63 months old and last check up was her 6 week follow up. Patient was previously on HCTZ which controlled well. Patient was told to follow up here for another BP check here. Patient's PCP has retired. BP today is 140/92. Patient denies headaches, vision problems or edema of feet or hands. Patient has been limiting salt intake also. Baby with Patient today.   O: Healthy WD,WN female Affect: normal Extremities: no edema of hands and feet noted, reflexes normal   A:3 months post partum with history borderline hypertension prior to pregnancy with HCTZ use.  PIH in pregnancy with Labetalol use. Patient has been off medication use for the past 2 months.Patient would like to resume use. ( checked her old Rx not 50 mg, just 25 mg)    P: Previous history of Hypertension with HCTZ use with good results. Rx HCTZ 25 mg one daily see order( consulted with Dr Sabra Heck agreeable) Patient will not start medication until lab in. Lab Potassium Patient will check PB at home she has a monitor and will advise.  Rv 2 weeks for recheck BP, prn

## 2013-10-24 ENCOUNTER — Telehealth: Payer: Self-pay | Admitting: Certified Nurse Midwife

## 2013-10-24 NOTE — Telephone Encounter (Signed)
Very important she have follow up here

## 2013-10-24 NOTE — Progress Notes (Signed)
Reviewed personally.  M. Suzanne Shereece Wellborn, MD.  

## 2013-10-24 NOTE — Telephone Encounter (Signed)
Patient canceled her upcoming 2 week recheck appointment 10/27/13. Patient will call later to reschedule.

## 2013-10-27 ENCOUNTER — Ambulatory Visit: Payer: Commercial Managed Care - PPO | Admitting: Certified Nurse Midwife

## 2013-10-28 NOTE — Telephone Encounter (Signed)
Spoke with patient. Appointment scheduled for 9/30 at 4pm with Regina Eck CNM. Patient agreeable to date and time.  Routing to provider for final review. Patient agreeable to disposition. Will close encounter

## 2013-11-02 ENCOUNTER — Ambulatory Visit (INDEPENDENT_AMBULATORY_CARE_PROVIDER_SITE_OTHER): Payer: Commercial Managed Care - PPO | Admitting: Certified Nurse Midwife

## 2013-11-02 ENCOUNTER — Encounter: Payer: Self-pay | Admitting: Certified Nurse Midwife

## 2013-11-02 VITALS — BP 120/82 | HR 72 | Resp 16 | Ht 62.0 in | Wt 210.0 lb

## 2013-11-02 DIAGNOSIS — I10 Essential (primary) hypertension: Secondary | ICD-10-CM

## 2013-11-02 NOTE — Patient Instructions (Signed)

## 2013-11-02 NOTE — Progress Notes (Signed)
Encounter reviewed by Dr. Brook Silva.  

## 2013-11-02 NOTE — Progress Notes (Signed)
43 y.o.MarriedAfrican [id number] for follow-up of hypertension being treated with  HCTZ 25 mg   Initiated 10/13/13.  Patient did not start medication. She waited for lab result which was normal( Potassium). She has been taking her BP in after sleeping and in late afternoon. BP has been consistently  In 122/80 range since visit here. BP in afternoon has in the same range except for two occurrence of 142/87 and 130/84. Denies headaches, visual problems,edema of hands and feet. Has decreased salt intake and working on increase of water daily. She has a 31 week old infant so it is hard to rest frequently, but trying. History of hypertension with HCTZ use for good control in past.  O:Healthy WD,WN female, appropriately dressed    Weight:210 no change Affect : normal    A:Borderline hypertension with no medication use Patient has Rx for 25mg  of HCTZ. Not symptomatic.  P:1-Discussed working on weight loss to help control also. Patient was to hold on starting medication due to normal BP. Patient to continue to monitor daily bid and report if increases and will start on 1/2 tablet of HCTZ daily. Aware of warning signs and need to advise. Given PCP list for establishment of care for further management.  2- RV aex, prn   20 minutes spent with patient  in face to face counseling regarding BP management.

## 2013-12-05 ENCOUNTER — Encounter: Payer: Self-pay | Admitting: Certified Nurse Midwife

## 2014-02-28 ENCOUNTER — Ambulatory Visit: Payer: Commercial Managed Care - PPO | Admitting: Certified Nurse Midwife

## 2014-10-05 ENCOUNTER — Ambulatory Visit (INDEPENDENT_AMBULATORY_CARE_PROVIDER_SITE_OTHER): Payer: Commercial Managed Care - PPO | Admitting: Certified Nurse Midwife

## 2014-10-05 ENCOUNTER — Encounter: Payer: Self-pay | Admitting: Certified Nurse Midwife

## 2014-10-05 VITALS — BP 116/70 | HR 70 | Resp 16 | Ht 62.5 in

## 2014-10-05 DIAGNOSIS — Z0000 Encounter for general adult medical examination without abnormal findings: Secondary | ICD-10-CM

## 2014-10-05 DIAGNOSIS — Z01419 Encounter for gynecological examination (general) (routine) without abnormal findings: Secondary | ICD-10-CM

## 2014-10-05 DIAGNOSIS — Z124 Encounter for screening for malignant neoplasm of cervix: Secondary | ICD-10-CM | POA: Diagnosis not present

## 2014-10-05 LAB — COMPREHENSIVE METABOLIC PANEL
ALT: 10 U/L (ref 6–29)
AST: 15 U/L (ref 10–30)
Albumin: 4.4 g/dL (ref 3.6–5.1)
Alkaline Phosphatase: 65 U/L (ref 33–115)
BUN: 11 mg/dL (ref 7–25)
CALCIUM: 9.8 mg/dL (ref 8.6–10.2)
CO2: 23 mmol/L (ref 20–31)
Chloride: 105 mmol/L (ref 98–110)
Creat: 0.77 mg/dL (ref 0.50–1.10)
GLUCOSE: 83 mg/dL (ref 65–99)
POTASSIUM: 4.6 mmol/L (ref 3.5–5.3)
Sodium: 140 mmol/L (ref 135–146)
TOTAL PROTEIN: 7.4 g/dL (ref 6.1–8.1)
Total Bilirubin: 0.8 mg/dL (ref 0.2–1.2)

## 2014-10-05 LAB — CBC
HEMATOCRIT: 40.7 % (ref 36.0–46.0)
Hemoglobin: 13.6 g/dL (ref 12.0–15.0)
MCH: 30 pg (ref 26.0–34.0)
MCHC: 33.4 g/dL (ref 30.0–36.0)
MCV: 89.6 fL (ref 78.0–100.0)
MPV: 10 fL (ref 8.6–12.4)
Platelets: 357 10*3/uL (ref 150–400)
RBC: 4.54 MIL/uL (ref 3.87–5.11)
RDW: 13.8 % (ref 11.5–15.5)
WBC: 10.4 10*3/uL (ref 4.0–10.5)

## 2014-10-05 LAB — LIPID PANEL
CHOLESTEROL: 233 mg/dL — AB (ref 125–200)
HDL: 63 mg/dL (ref >=46)
LDL Cholesterol: 144 mg/dL — ABNORMAL HIGH (ref <130)
TRIGLYCERIDES: 131 mg/dL (ref <150)
Total CHOL/HDL Ratio: 3.7 Ratio (ref <=5.0)
VLDL: 26 mg/dL (ref <30)

## 2014-10-05 LAB — TSH: TSH: 2.646 u[IU]/mL (ref 0.350–4.500)

## 2014-10-05 NOTE — Progress Notes (Signed)
44 y.o. [id number] Married  African American Fe here for annual exam. Periods normal,no issues.Sees Urgent care if needed. No health issues in past year. Did not take HCTZ for borderline hypertension due to checking BP at home and has remained normal now.. Weight down 8 pounds! Still working as a Quarry manager and caring for 42 year old. No other health issues today. Desires fasting labs.  Patient's last menstrual period was 09/18/2014.          Sexually active: Yes.    The current method of family planning is tubal ligation.    Exercising: No.  exercise Smoker:  no  Health Maintenance: Pap: 12/14 neg MMG: 05-03-12 category 3, birads 1:neg  3 D mammogram yearly Colonoscopy:  none BMD:   none TDaP:  2015 Labs: none Self breast exam: done monthly   reports that she has quit smoking. She does not have any smokeless tobacco history on file. She reports that she drinks about 1.2 oz of alcohol per week. She reports that she does not use illicit drugs.  Past Medical History  Diagnosis Date  . Hypertension   . Miscarriage   . Abnormal Pap smear 1992 2014    1992 Leep, 02/2012 ASCUS HPV neg  . HPV in female     Past Surgical History  Procedure Laterality Date  . Knee arthrocentesis  plate and screws  . Cervical biopsy  w/ loop electrode excision    . Cesarean section N/A 07/19/2013    Procedure: CESAREAN SECTION;  Surgeon: Allyn Kenner, DO;  Location: [facility];  Service: Obstetrics;  Laterality: N/A;  . Tubal ligation      Current Outpatient Prescriptions  Medication Sig Dispense Refill  . DiphenhydrAMINE HCl (BENADRYL PO) Take by mouth as needed.     No current facility-administered medications for this visit.    Family History  Problem Relation Age of Onset  . Hypertension Mother   . Diabetes Mother   . Breast cancer Mother 84  . Heart disease Father   . Hypertension Maternal Grandmother   . Hypertension Maternal Grandfather     ROS:  Pertinent items are noted in HPI.  Otherwise, a  comprehensive ROS was negative.  Exam:   BP 116/70 mmHg  Pulse 70  Resp 16  Ht 5' 2.5" (1.588 m)  Wt   LMP 09/18/2014 Height: 5' 2.5" (158.8 cm) Ht Readings from Last 3 Encounters:  10/05/14 5' 2.5" (1.588 m)  11/02/13 5\' 2"  (1.575 m)  10/13/13 5\' 2"  (1.575 m)    General appearance: alert, cooperative and appears stated age Head: Normocephalic, without obvious abnormality, atraumatic Neck: no adenopathy, supple, symmetrical, trachea midline and thyroid normal to inspection and palpation Lungs: clear to auscultation bilaterally Breasts: normal appearance, no masses or tenderness, Inspection negative, No nipple retraction or dimpling, No nipple discharge or bleeding Heart: regular rate and rhythm Abdomen: soft, non-tender; no masses,  no organomegaly Extremities: extremities normal, atraumatic, no cyanosis or edema Skin: Skin color, texture, turgor normal. No rashes or lesions Lymph nodes: Cervical, supraclavicular, and axillary nodes normal. No abnormal inguinal nodes palpated Neurologic: Grossly normal   Pelvic: External genitalia:  no lesions              Urethra:  normal appearing urethra with no masses, tenderness or lesions              Bartholin's and Skene's: normal                 Vagina: normal appearing  vagina with normal color and discharge, no lesions              Cervix: normal,non tender, no lesions              Pap taken: Yes.   Bimanual Exam:  Uterus:  normal size, contour, position, consistency, mobility, non-tender              Adnexa: normal adnexa and no mass, fullness, tenderness               Rectovaginal: Confirms               Anus:  normal appearance, declined exam  Chaperone present: yes  A:  Well Woman with normal exam  History of borderline hypertension,no medication use now  Family history of breast cancer mother  Screening labs  P:   Reviewed health and wellness pertinent to exam  Aware to continue to work on weight and exercise and advise  if BP changes needs to advise.   Stressed mammogram yearly. Declines genetic information, aware of.  Labs: CBC,CMP,Lipid panel,TSH,Vitamin D  Pap smear as above with HPVHR   counseled on breast self exam, mammography screening, adequate intake of calcium and vitamin D, diet and exercise  return annually or prn  An After Visit Summary was printed and given to the patient.

## 2014-10-05 NOTE — Patient Instructions (Signed)

## 2014-10-06 ENCOUNTER — Other Ambulatory Visit: Payer: Self-pay | Admitting: Certified Nurse Midwife

## 2014-10-06 ENCOUNTER — Other Ambulatory Visit: Payer: Self-pay

## 2014-10-06 DIAGNOSIS — E559 Vitamin D deficiency, unspecified: Secondary | ICD-10-CM

## 2014-10-06 DIAGNOSIS — R899 Unspecified abnormal finding in specimens from other organs, systems and tissues: Secondary | ICD-10-CM

## 2014-10-06 LAB — VITAMIN D 25 HYDROXY (VIT D DEFICIENCY, FRACTURES): Vit D, 25-Hydroxy: 13 ng/mL — ABNORMAL LOW (ref 30–100)

## 2014-10-06 MED ORDER — VITAMIN D (ERGOCALCIFEROL) 1.25 MG (50000 UNIT) PO CAPS: 50000.0000 [IU] | ORAL_CAPSULE | ORAL | Status: DC

## 2014-10-06 NOTE — Progress Notes (Signed)
Reviewed personally.  M. Suzanne Asheton Viramontes, MD.  

## 2014-10-10 LAB — IPS PAP TEST WITH HPV

## 2014-10-23 IMAGING — US US OB FOLLOW-UP
1 series · 12 of 28 positions shown · non-contrast
Comparison: none

[Series 1: us ob follow-up · 12 of 29 slices shown]
[im 2/29]
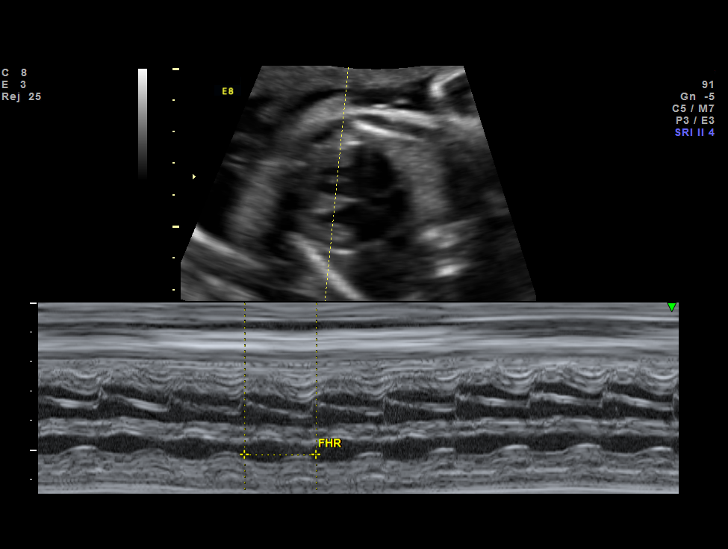
[im 4/29]
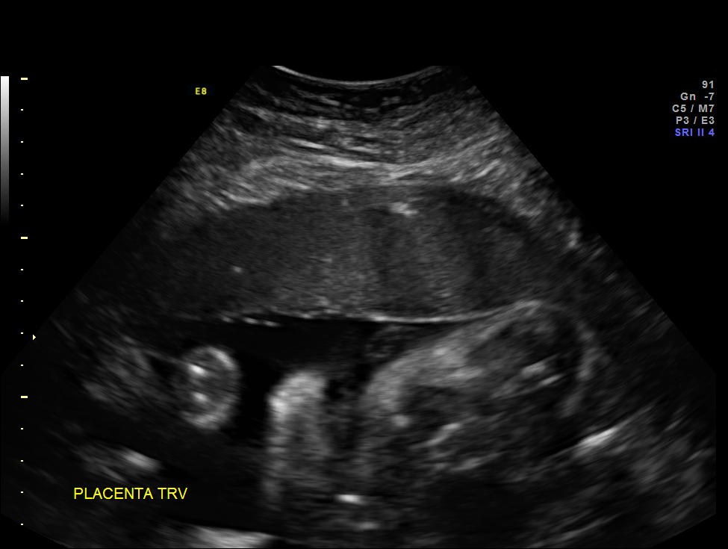
[im 6/29]
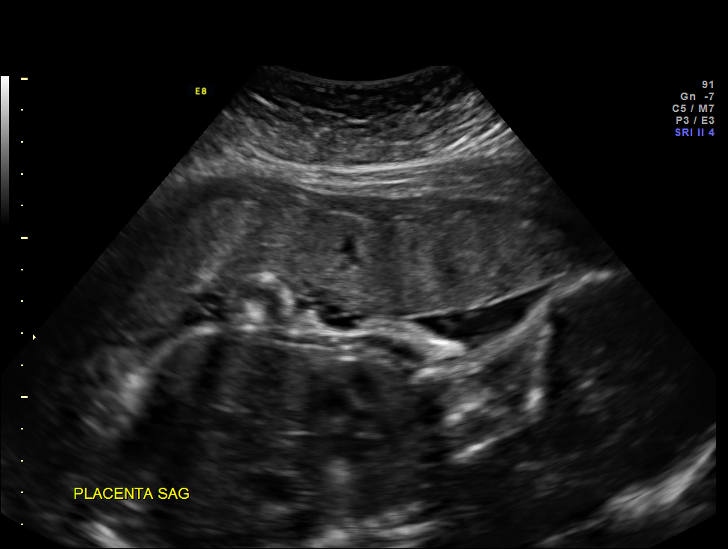
[im 9/29]
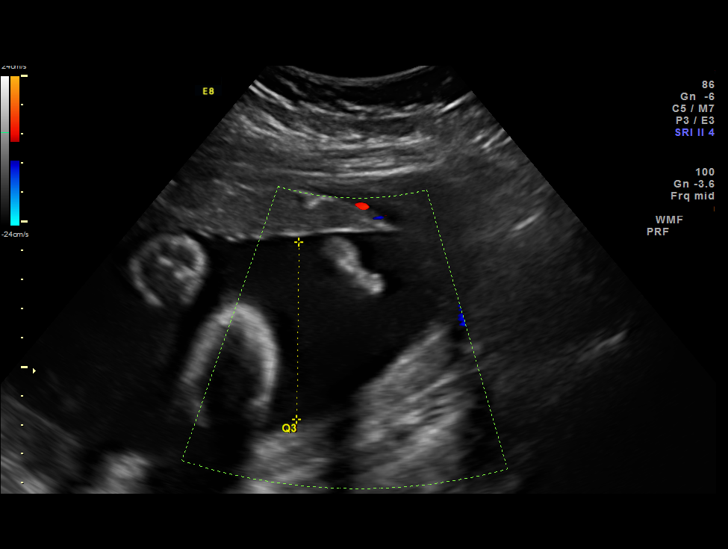
[im 11/29]
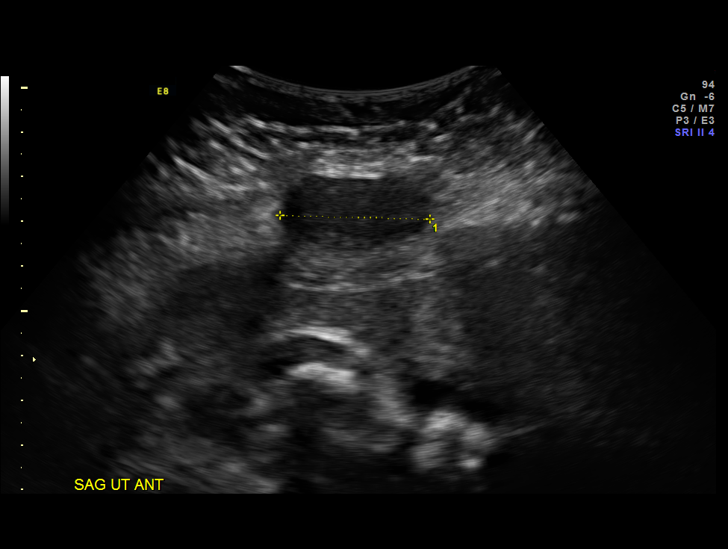
[im 13/29]
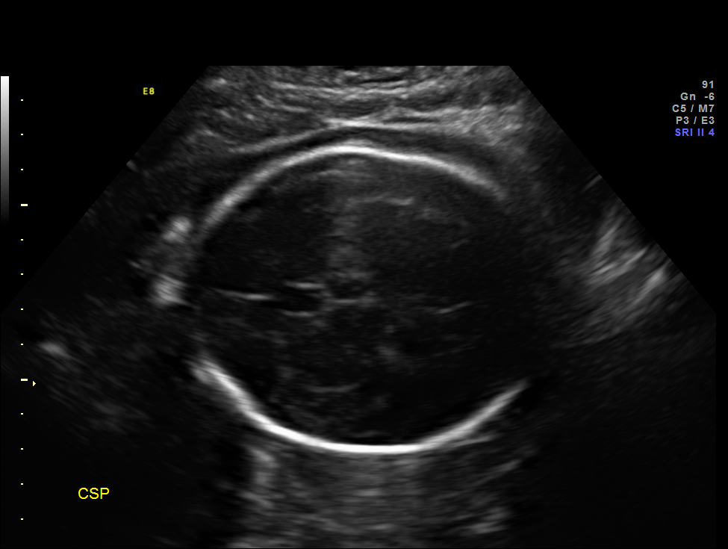
[im 16/29]
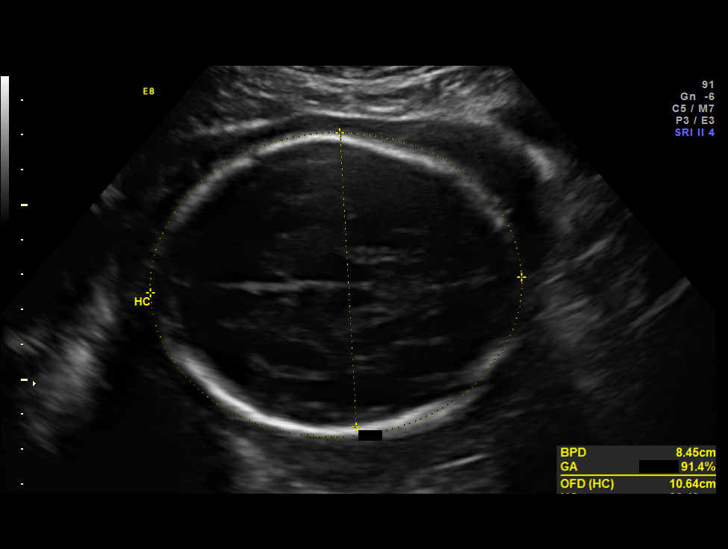
[im 18/29]
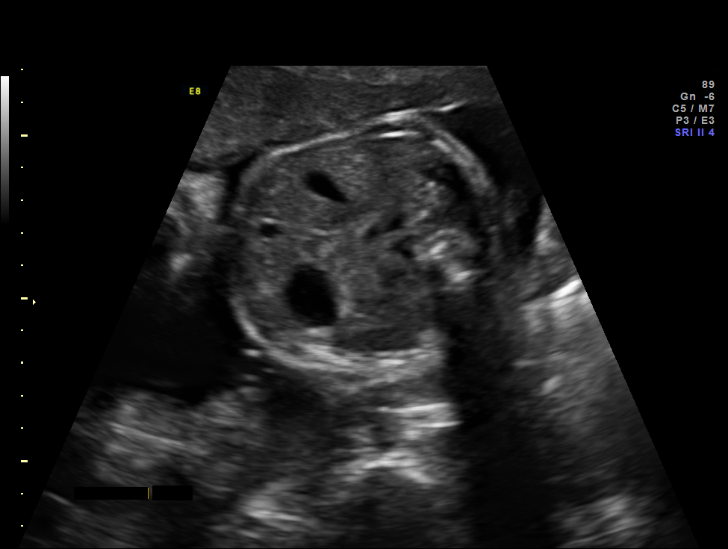
[im 20/29]
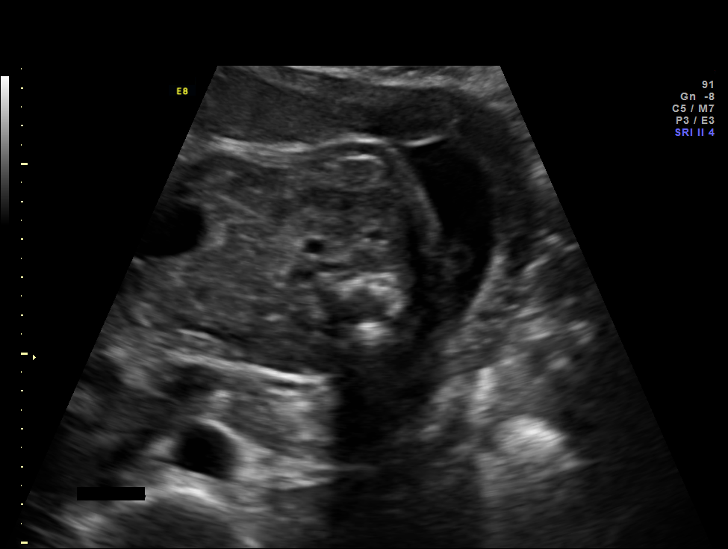
[im 23/29]
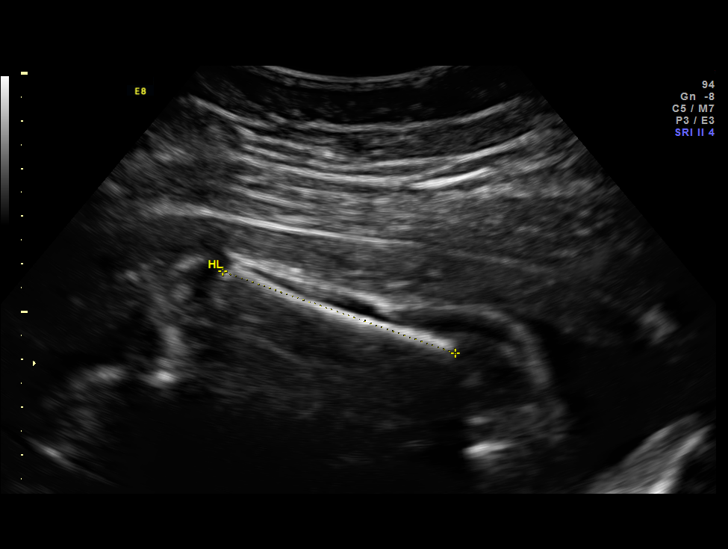
[im 25/29]
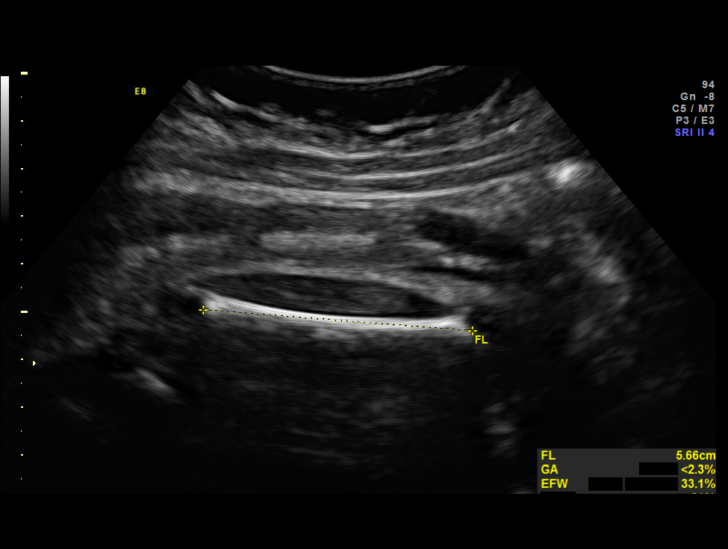
[im 27/29]
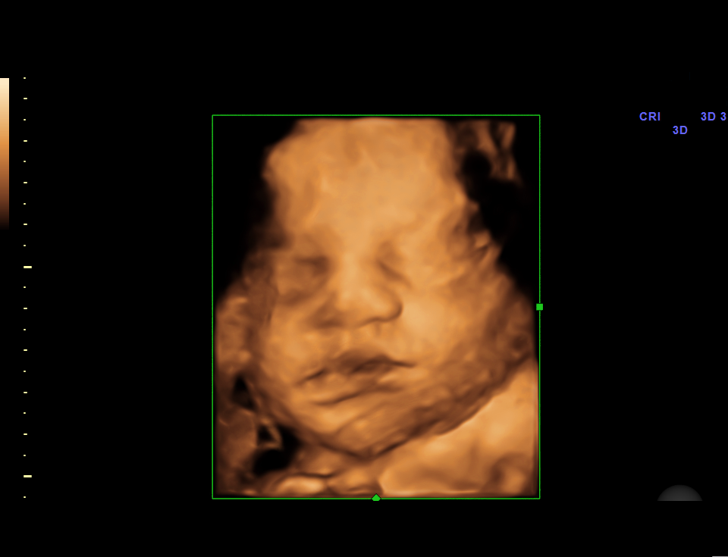

[12 of 28 positions shown; findings below may reference images not displayed]

OBSTETRICS REPORT
                      (Signed Final 07/04/2013 [DATE])

Service(s) Provided

 US OB FOLLOW UP                                       76816.1
Indications

 Hypertension - Chronic with superimposed
 preeclampsia
 Advanced maternal age (AMA), Multigravida - low
 risk NIPS
 Poor obstetric history: Previous preeclampsia /
 eclampsia/gestational HTN
 Poor obstetric history: Previous preterm delivery
 (32 weeks)
 Previous cervical surgery (LEEP 8223)
 Uterine fibroid
 BMZ [DATE] & [DATE]
Fetal Evaluation

 Num Of Fetuses:    1
 Fetal Heart Rate:  141                          bpm
 Cardiac Activity:  Observed
 Presentation:      Cephalic
 Placenta:          Anterior, above cervical os
 P. Cord            Previously Visualized
 Insertion:

 Amniotic Fluid
 AFI FV:      Subjectively within normal limits
 AFI Sum:     19.74   cm       75  %Tile     Larg Pckt:    6.51  cm
 RUQ:   3.37    cm   RLQ:    6.51   cm    LUQ:   3.76    cm   LLQ:    6.1    cm
Biophysical Evaluation

 Amniotic F.V:   Pocket => 2 cm two         F. Tone:        Observed
                 planes
 F. Movement:    Observed                   Score:          [DATE]
 F. Breathing:   Observed
Biometry

 BPD:     84.3  mm     G. Age:  33w 6d                CI:         80.7   70 - 86
 OFD:    104.5  mm                                    FL/HC:      18.8   19.1 -

 HC:     301.7  mm     G. Age:  33w 3d       53  %    HC/AC:      1.14   0.96 -

 AC:     263.5  mm     G. Age:  30w 3d       11  %    FL/BPD:     67.4   71 - 87
 FL:      56.8  mm     G. Age:  29w 6d      < 3  %    FL/AC:      21.6   20 - 24
 HUM:     51.7  mm     G. Age:  30w 1d       13  %

 Est. FW:    6533  gm    3 lb 10 oz      33  %
Gestational Age

 LMP:           32w 0d        Date:  11/22/12                 EDD:   08/29/13
 U/S Today:     31w 6d                                        EDD:   08/30/13
 Best:          32w 0d     Det. By:  LMP  (11/22/12)          EDD:   08/29/13
Anatomy

 Cranium:          Appears normal         Aortic Arch:      Previously seen
 Fetal Cavum:      Appears normal         Ductal Arch:      Previously seen
 Ventricles:       Appears normal         Diaphragm:        Previously seen
 Choroid Plexus:   Previously seen        Stomach:          Appears normal, left
                                                            sided
 Cerebellum:       Previously seen        Abdomen:          Appears normal
 Posterior Fossa:  Previously seen        Abdominal Wall:   Previously seen
 Nuchal Fold:      Not applicable (>20    Cord Vessels:     Previously seen
                   wks GA)
 Face:             Orbits and profile     Kidneys:          Appear normal
                   previously seen
 Lips:             Previously seen        Bladder:          Appears normal
 Palate:           Previously seen        Spine:            Limited views prev
                                                            seen as NL
 Heart:            Appears normal         Lower             Previously seen
                   (4CH, axis, and        Extremities:
                   situs)
 RVOT:             Previously seen        Upper             Previously seen
                                          Extremities:
 LVOT:             Previously seen

 Other:  Heels and 5th digit previously seen. Technically difficult due to
         maternal habitus and fetal position.
Cervix Uterus Adnexa

 Cervix:       Not visualized (advanced GA >12wks)

 Adnexa:     No abnormality visualized.
Impression

 SIUP at 32+0 weeks
 Normal interval anatomy; anatomic survey complete
 Normal amniotic fluid volume
 Appropriate interval growth with EFW at the 33rd %tile
Recommendations

 Follow-up ultrasound for growth in 3 weeks
 Continue current monitoring protocol

 questions or concerns.

## 2015-01-08 ENCOUNTER — Other Ambulatory Visit: Payer: Commercial Managed Care - PPO

## 2015-01-08 ENCOUNTER — Other Ambulatory Visit (INDEPENDENT_AMBULATORY_CARE_PROVIDER_SITE_OTHER): Payer: Commercial Managed Care - PPO

## 2015-01-08 DIAGNOSIS — E559 Vitamin D deficiency, unspecified: Secondary | ICD-10-CM

## 2015-01-08 DIAGNOSIS — R899 Unspecified abnormal finding in specimens from other organs, systems and tissues: Secondary | ICD-10-CM

## 2015-01-09 LAB — VITAMIN D 25 HYDROXY (VIT D DEFICIENCY, FRACTURES): Vit D, 25-Hydroxy: 28 ng/mL — ABNORMAL LOW (ref 30–100)

## 2015-04-09 ENCOUNTER — Other Ambulatory Visit (INDEPENDENT_AMBULATORY_CARE_PROVIDER_SITE_OTHER): Payer: Commercial Managed Care - PPO

## 2015-04-09 DIAGNOSIS — R7989 Other specified abnormal findings of blood chemistry: Secondary | ICD-10-CM

## 2015-04-09 DIAGNOSIS — E7800 Pure hypercholesterolemia, unspecified: Secondary | ICD-10-CM

## 2015-04-09 LAB — LIPID PANEL
CHOL/HDL RATIO: 4 ratio (ref <=5.0)
Cholesterol: 256 mg/dL — ABNORMAL HIGH (ref 125–200)
HDL: 64 mg/dL (ref >=46)
LDL Cholesterol: 177 mg/dL — ABNORMAL HIGH (ref <130)
Triglycerides: 73 mg/dL (ref <150)
VLDL: 15 mg/dL (ref <30)

## 2015-04-10 LAB — VITAMIN D 25 HYDROXY (VIT D DEFICIENCY, FRACTURES): Vit D, 25-Hydroxy: 28 ng/mL — ABNORMAL LOW (ref 30–100)

## 2015-04-11 ENCOUNTER — Telehealth: Payer: Self-pay

## 2015-04-11 NOTE — Telephone Encounter (Signed)
Spoke with patient. Advised of message and results as seen below from [place]. She verbalizes understanding. Declines referral to PCP at this time. States she would like to establish care with PCP on her own at this time. She will contact the office once she has schedule a primary care appointment so that labs can be faxed for her OV.   Routing to provider for final review. Patient agreeable to disposition. Will close encounter.

## 2015-04-11 NOTE — Telephone Encounter (Signed)
-----   Message from Regina Eck, CNM sent at 04/10/2015  7:46 AM EST ----- Notify patient Vitamin D is the same 28, so maintaining would suggest Vitamin D 3 OTC 1000 Iu daily Lipid panel cholesterol has increased from 233 to 256 and LDL from 130 to 177,patient needs PCP referral for management Bridget Fields if possible, in the mean time work on portion control and cholesterol restricted diet. Increase protein and vegetable serving, regular exercise at least 3 times weekly

## 2015-10-01 ENCOUNTER — Encounter: Payer: Self-pay | Admitting: Medical

## 2015-10-01 ENCOUNTER — Ambulatory Visit (INDEPENDENT_AMBULATORY_CARE_PROVIDER_SITE_OTHER): Payer: Commercial Managed Care - PPO | Admitting: Medical

## 2015-10-01 VITALS — BP 186/110 | HR 73 | Wt 204.0 lb

## 2015-10-01 DIAGNOSIS — E559 Vitamin D deficiency, unspecified: Secondary | ICD-10-CM

## 2015-10-01 DIAGNOSIS — F17200 Nicotine dependence, unspecified, uncomplicated: Secondary | ICD-10-CM | POA: Insufficient documentation

## 2015-10-01 DIAGNOSIS — E669 Obesity, unspecified: Secondary | ICD-10-CM

## 2015-10-01 DIAGNOSIS — I10 Essential (primary) hypertension: Secondary | ICD-10-CM | POA: Diagnosis not present

## 2015-10-01 DIAGNOSIS — Z720 Tobacco use: Secondary | ICD-10-CM | POA: Diagnosis not present

## 2015-10-01 DIAGNOSIS — E785 Hyperlipidemia, unspecified: Secondary | ICD-10-CM | POA: Insufficient documentation

## 2015-10-01 MED ORDER — HYDROCHLOROTHIAZIDE 25 MG PO TABS: 25.0000 mg | ORAL_TABLET | Freq: Every day | ORAL | 0 refills | Status: DC

## 2015-10-01 NOTE — Patient Instructions (Signed)
Encounter Diagnoses  Name Primary?  . Essential hypertension Yes  . Hyperlipidemia   . Vitamin D deficiency   . Obesity   . Smoker     Recommendations:  Begin Hydrochlorothiazide daily for blood pressure in the morning  Begin exercising such as walking 45-60 minutes daily  Limit or avoid high cholesterol foods such as red meat, beef, pork, fried food, fast food, cookies, ice cream, cake  DO EAT fruits, vegetables, whole grains  STOP SMOKING!  Work on healthier lifestyle  Limit salt  Lets recheck in 2- 3 months

## 2015-10-01 NOTE — Progress Notes (Signed)
Subjective: Chief Complaint  Patient presents with  . New Patient (Initial Visit)    GET ESTABLISHED. sees [facility]. Has signed records release. no problems or concerns   Here as a new patient to establish care.  Sees [facility] clinic.  otherwise hasn't been seeing PCP.  Was seeing Dr. Elisabeth Pigeon in 2012, former PCP.  Her gynecologist advised she see PCP.   New to [facility], been here 3 years.    Has hx/o hypertension and hyperlipidemia.   First diagnosed with hypertension in pregnancy, but has always went up and down on BPs, no consistency.  She claims white coat hypertension.   No prior cholesterol medication.  Has been on fluid pill for high blood pressure, last used this 2 years ago.    Smoker x 10 years, 1 cigar daily.  Not exercising.  Diet - pretty good, has lost from 225 down to current weight in recent 4-5 months.   Denies chest pain, SOB, palpations, edema.  She does notes normal echocardiogram several years ago in [place] due to palpations with swimming lessons.   Past Medical History:  Diagnosis Date  . Abnormal Pap smear 1992 2014   1992 Leep, 02/2012 ASCUS HPV neg  . HPV in female   . Hypertension   . Miscarriage    Social History   Social History  . Marital status: Married    Spouse name: N/A  . Number of children: N/A  . Years of education: N/A   Occupational History  . Not on file.   Social History Main Topics  . Smoking status: Current Every Day Smoker    Types: Cigars  . Smokeless tobacco: Never Used  . Alcohol use 1.2 oz/week    2 Standard drinks or equivalent per week  . Drug use: No  . Sexual activity: Yes    Partners: Male    Birth control/ protection: Surgical     Comment: BTL   Other Topics Concern  . Not on file   Social History Narrative  . No narrative on file   Family History  Problem Relation Age of Onset  . Hypertension Mother   . Diabetes Mother   . Breast cancer Mother 45  . Stroke Father 54    smoker  .  Hypertension Father   . Hypertension Maternal Grandmother   . Hypertension Maternal Grandfather      ROS as in subjective  Objective: BP (!) 186/110   Pulse 73   Wt 204 lb (92.5 kg)   LMP 09/25/2015   BMI 36.72 kg/m   BP Readings from Last 3 Encounters:  10/01/15 (!) 186/110  10/05/14 116/70  11/02/13 120/82   General appearance: alert, no distress, WD/WN Heart: RRR, normal S1, S2, no murmurs Lungs: CTA bilaterally, no wheezes, rhonchi, or rales Pulses: 2+ symmetric, upper and lower extremities, normal cap refill Ext: no edema Neuro: CN2-12 intact, non focal exam   Assessment: Encounter Diagnoses  Name Primary?  . Essential hypertension Yes  . Hyperlipidemia   . Vitamin D deficiency   . Obesity   . Smoker     Plan: discussed diagnoses of hypertension, hyperlipidemia.   advised  she stop tobacco, start exercising, discussed diet, exercise, limiting salt, and lifestyle recommendations to improve on both BP and cholesterol.   Begin HCTZ.  She has been on this prior.  Plan f/u 2-3 months, and at that time will check EKG, possibly repeat labs.   Will hold off on statin at this time.  discussed goals of treatment, and risks of uncontrolled hypertension and cholesterol.  Counseled on smoking cessation.   She seems motivated to work on these changes.  C/t efforts at weight loss.   Lilyannah was seen today for new patient (initial visit).  Diagnoses and all orders for this visit:  Essential hypertension  Hyperlipidemia  Vitamin D deficiency  Obesity  Smoker  Other orders -     hydrochlorothiazide (HYDRODIURIL) 25 MG tablet; Take 1 tablet (25 mg total) by mouth daily.

## 2015-10-17 ENCOUNTER — Encounter: Payer: Self-pay | Admitting: Certified Nurse Midwife

## 2015-10-17 ENCOUNTER — Ambulatory Visit (INDEPENDENT_AMBULATORY_CARE_PROVIDER_SITE_OTHER): Payer: Commercial Managed Care - PPO | Admitting: Certified Nurse Midwife

## 2015-10-17 VITALS — BP 120/78 | HR 74 | Resp 16 | Ht 62.5 in | Wt 201.0 lb

## 2015-10-17 DIAGNOSIS — Z01419 Encounter for gynecological examination (general) (routine) without abnormal findings: Secondary | ICD-10-CM

## 2015-10-17 DIAGNOSIS — Z124 Encounter for screening for malignant neoplasm of cervix: Secondary | ICD-10-CM

## 2015-10-17 DIAGNOSIS — N852 Hypertrophy of uterus: Secondary | ICD-10-CM | POA: Diagnosis not present

## 2015-10-17 DIAGNOSIS — Z0000 Encounter for general adult medical examination without abnormal findings: Secondary | ICD-10-CM

## 2015-10-17 LAB — POCT URINALYSIS DIPSTICK
BILIRUBIN UA: NEGATIVE
Blood, UA: NEGATIVE
Glucose, UA: NEGATIVE
Ketones, UA: NEGATIVE
LEUKOCYTES UA: NEGATIVE
NITRITE UA: NEGATIVE
PH UA: 5
Protein, UA: NEGATIVE
Urobilinogen, UA: NEGATIVE

## 2015-10-17 NOTE — Patient Instructions (Signed)

## 2015-10-17 NOTE — Progress Notes (Signed)
45 y.o. [id number] Married  African American Fe here for annual exam. Periods normal, no issues. Sees PCP for hypertension management,labs and aex. All stable at this point per patient. Busy with her job and 43 year old!. No health concerns today.  Patient's last menstrual period was 09/25/2015.          Sexually active: Yes.    The current method of family planning is tubal ligation.    Exercising: No.  exercise Smoker:  no  Health Maintenance: Pap:  10-05-14 neg HPV HR neg hx of LEEP per patient over 44yrs ago MMG:  05-03-12 category 3 density birads 1:neg Colonoscopy:  none BMD:   none TDaP:  2015 Shingles: no Pneumonia: no Hep C and HIV: HIV done during pregnancy Labs: poct urine-neg Self breast exam: done monthly   reports that she has been smoking Cigars.  She has never used smokeless tobacco. She reports that she does not drink alcohol or use drugs.  Past Medical History:  Diagnosis Date  . Abnormal Pap smear 1992 2014   1992 Leep, 02/2012 ASCUS HPV neg  . HPV in female   . Hypertension   . Miscarriage     Past Surgical History:  Procedure Laterality Date  . CERVICAL BIOPSY  W/ LOOP ELECTRODE EXCISION    . CESAREAN SECTION N/A 07/19/2013   Procedure: CESAREAN SECTION;  Surgeon: Allyn Kenner, DO;  Location: [facility];  Service: Obstetrics;  Laterality: N/A;  . KNEE ARTHROCENTESIS  plate and screws  . TUBAL LIGATION      Current Outpatient Prescriptions  Medication Sig Dispense Refill  . Acetaminophen (TYLENOL PO) Take by mouth 3 times/day as needed-between meals & bedtime.    . cholecalciferol (VITAMIN D) 1000 units tablet Take 1,000 Units by mouth daily.    . DiphenhydrAMINE HCl (BENADRYL PO) Take by mouth as needed.    . hydrochlorothiazide (HYDRODIURIL) 25 MG tablet Take 1 tablet (25 mg total) by mouth daily. 90 tablet 0  . Multiple Vitamins-Calcium (ONE-A-DAY WOMENS PO) Take 1 capsule by mouth daily.     No current facility-administered medications for this visit.      Family History  Problem Relation Age of Onset  . Hypertension Mother   . Diabetes Mother   . Breast cancer Mother 94  . Stroke Father 81    smoker  . Hypertension Father   . Hypertension Maternal Grandmother   . Hypertension Maternal Grandfather     ROS:  Pertinent items are noted in HPI.  Otherwise, a comprehensive ROS was negative.  Exam:   BP 120/78   Pulse 74   Resp 16   Ht 5' 2.5" (1.588 m)   Wt 201 lb (91.2 kg)   LMP 09/25/2015   BMI 36.18 kg/m  Height: 5' 2.5" (158.8 cm) Ht Readings from Last 3 Encounters:  10/17/15 5' 2.5" (1.588 m)  10/05/14 5' 2.5" (1.588 m)  11/02/13 5\' 2"  (1.575 m)    General appearance: alert, cooperative and appears stated age Head: Normocephalic, without obvious abnormality, atraumatic Neck: no adenopathy, supple, symmetrical, trachea midline and thyroid normal to inspection and palpation Lungs: clear to auscultation bilaterally Breasts: normal appearance, no masses or tenderness, No nipple retraction or dimpling, No nipple discharge or bleeding, No axillary or supraclavicular adenopathy Heart: regular rate and rhythm Abdomen: soft, non-tender; no masses,  no organomegaly Extremities: extremities normal, atraumatic, no cyanosis or edema Skin: Skin color, texture, turgor normal. No rashes or lesions Lymph nodes: Cervical, supraclavicular, and axillary nodes normal.  No abnormal inguinal nodes palpated Neurologic: Grossly normal   Pelvic: External genitalia:  no lesions              Urethra:  normal appearing urethra with no masses, tenderness or lesions              Bartholin's and Skene's: normal                 Vagina: normal appearing vagina with normal color and discharge, no lesions              Cervix: multiparous appearance, no cervical motion tenderness, no lesions and bleeding with pap only, previous LEEP              Pap taken: Yes.   Bimanual Exam:  Uterus:  enlarged, 8-10 week size non tender,no masses palpated weeks  size              Adnexa: normal adnexa and no mass, fullness, tenderness               Rectovaginal: Confirms               Anus:  normal sphincter tone, no lesions  Chaperone present: yes  A:  Well Woman with normal exam  Contraception BTL  Enlarged uterus  History of LEEP   Mammogram overdue  Hypertension with PCP management  Family history of breast cancer mother age 28   P:   Reviewed health and wellness pertinent to exam  Discussed finding of enlarged uterus and possble etiology of adenomyosis, fibroid, mass or change. Discussed PUS for evaluation patient agreeable. Patient will be called with insurance information and scheduled. Questions addressed.  Stressed importance of pap smear.  Given Breast center # to call. Patient will schedule! Patient feels her mother was BRACA negative but will check. Aware of genetic screening option.  Will follow up with MD as indicated  Pap smear as above with HPV reflex   counseled on breast self exam, mammography screening, adequate intake of calcium and vitamin D, diet and exercise  return annually or prn  An After Visit Summary was printed and given to the patient.

## 2015-10-18 ENCOUNTER — Other Ambulatory Visit: Payer: Self-pay | Admitting: Certified Nurse Midwife

## 2015-10-18 DIAGNOSIS — Z1231 Encounter for screening mammogram for malignant neoplasm of breast: Secondary | ICD-10-CM

## 2015-10-18 LAB — IPS PAP TEST WITH REFLEX TO HPV

## 2015-10-21 NOTE — Progress Notes (Signed)
Encounter reviewed [place] Thielman, MD   

## 2015-10-26 ENCOUNTER — Ambulatory Visit
Admission: RE | Admit: 2015-10-26 | Discharge: 2015-10-26 | Disposition: A | Discharge status: Home / Self Care | Payer: Commercial Managed Care - PPO | Source: Ambulatory Visit | Attending: Certified Nurse Midwife | Admitting: Certified Nurse Midwife

## 2015-10-26 DIAGNOSIS — Z1231 Encounter for screening mammogram for malignant neoplasm of breast: Secondary | ICD-10-CM

## 2015-11-06 ENCOUNTER — Telehealth: Payer: Self-pay | Admitting: Certified Nurse Midwife

## 2015-11-06 DIAGNOSIS — N852 Hypertrophy of uterus: Secondary | ICD-10-CM

## 2015-11-06 NOTE — Telephone Encounter (Signed)
Patient left a voicemail at lunch and said, "I am nervous because I have not received a call with my Pap results or a call to schedule an ultrasound yet."

## 2015-11-06 NOTE — Telephone Encounter (Signed)
Spoke with patient. Advised of results as seen below. Patient is agreeable and verbalizes understanding. Patient would like to proceed with scheduling PUS at this time depending on benefit information. Advised her benefits will be checked through our insurance and billing department and she will be contacted to discuss this information and to be scheduled. Patient is agreeable. Order for PUS placed for evaluation of enlarged uterus. Please see aex note from 10/17/2015.   Notes Recorded by Susy Manor, CMA on 10/19/2015 at 9:13 AM EDT aex is 9/18 ------  Notes Recorded by Regina Eck, CNM on 10/19/2015 at 8:12 AM EDT Pap smear negative 02  Cc; Lerry Liner  Routing to provider for final review. Patient agreeable to disposition. Will close encounter.

## 2015-11-07 ENCOUNTER — Telehealth: Payer: Self-pay | Admitting: Obstetrics & Gynecology

## 2015-11-07 NOTE — Telephone Encounter (Signed)
Called patient to review benefits for a recommended procedure. Left Voicemail requesting a call back. °

## 2015-11-07 NOTE — Telephone Encounter (Signed)
Spoke with patient in regards to benefits and scheduling recommended ultrasound. Patient understood information provided. Patient has declined to schedule until after the first of the year, due to other obligations.   Routing to Dr Sabra Heck for review.

## 2015-12-31 ENCOUNTER — Ambulatory Visit (INDEPENDENT_AMBULATORY_CARE_PROVIDER_SITE_OTHER): Payer: Commercial Managed Care - PPO | Admitting: Medical

## 2015-12-31 ENCOUNTER — Encounter: Payer: Self-pay | Admitting: Medical

## 2015-12-31 VITALS — BP 146/90 | HR 79 | Wt 206.0 lb

## 2015-12-31 DIAGNOSIS — F17200 Nicotine dependence, unspecified, uncomplicated: Secondary | ICD-10-CM | POA: Diagnosis not present

## 2015-12-31 DIAGNOSIS — E669 Obesity, unspecified: Secondary | ICD-10-CM | POA: Diagnosis not present

## 2015-12-31 DIAGNOSIS — Z23 Encounter for immunization: Secondary | ICD-10-CM | POA: Diagnosis not present

## 2015-12-31 DIAGNOSIS — E785 Hyperlipidemia, unspecified: Secondary | ICD-10-CM

## 2015-12-31 DIAGNOSIS — E559 Vitamin D deficiency, unspecified: Secondary | ICD-10-CM

## 2015-12-31 DIAGNOSIS — I10 Essential (primary) hypertension: Secondary | ICD-10-CM

## 2015-12-31 LAB — BASIC METABOLIC PANEL
BUN: 8 mg/dL (ref 7–25)
CHLORIDE: 104 mmol/L (ref 98–110)
CO2: 28 mmol/L (ref 20–31)
Calcium: 9.5 mg/dL (ref 8.6–10.2)
Creat: 0.86 mg/dL (ref 0.50–1.10)
GLUCOSE: 91 mg/dL (ref 65–99)
Potassium: 3.9 mmol/L (ref 3.5–5.3)
Sodium: 137 mmol/L (ref 135–146)

## 2015-12-31 MED ORDER — HYDROCHLOROTHIAZIDE 25 MG PO TABS: 25.0000 mg | ORAL_TABLET | Freq: Every day | ORAL | 3 refills | Status: DC

## 2015-12-31 NOTE — Progress Notes (Signed)
Subjective: Chief Complaint  Patient presents with  . meds check    meds check, flu shot    Here for f/u.  She was a new patient back in 10/01/15 at which time we started her on HCTZ for BP and discussed hyperlipidemia.   She is compliant with HCTZ.  Here for med check and labs.    Taking the BP medication every daily, but sometimes doesn't take it on Sundays when she is in church.   Checking BPs.   Seeing 140s SBP.  Morning BPs look good 120/80.    130/80s in morning/lunch.  Usually 140/90s in the evenings.    Had flu shot today here.  Has hx/o hypertension and hyperlipidemia.   First diagnosed with hypertension in pregnancy, but has always went up and down on BPs, no consistency.  She claims white coat hypertension.   No prior cholesterol medication.  Has been on fluid pill for high blood pressure 2 years ago prior to 09/2015.  Smoker x 10 years, 1 cigar daily.  Has been exercising since last visit here.   Denies chest pain, SOB, palpations, edema.  She does notes normal echocardiogram several years ago in [place] due to palpations with swimming lessons.   Since last visit had mammogram.  Has had tubal ligation, not on birth control.  Taking Vit D 2000 IU daily along with multivitamin too.  Past Medical History:  Diagnosis Date  . Abnormal Pap smear 1992 2014   1992 [place], 02/2012 ASCUS HPV neg  . HPV in female   . Hypertension   . Miscarriage    Current Outpatient Prescriptions on File Prior to Visit  Medication Sig Dispense Refill  . Acetaminophen (TYLENOL PO) Take by mouth 3 times/day as needed-between meals & bedtime.    . cholecalciferol (VITAMIN D) 1000 units tablet Take 1,000 Units by mouth daily.    . DiphenhydrAMINE HCl (BENADRYL PO) Take by mouth as needed.    . Multiple Vitamins-Calcium (ONE-A-DAY WOMENS PO) Take 1 capsule by mouth daily.     No current facility-administered medications on file prior to visit.     Family History  Problem Relation Age of Onset  .  Hypertension Mother   . Diabetes Mother   . Breast cancer Mother 63  . Stroke Father 33    smoker  . Hypertension Father   . Hypertension Maternal Grandmother   . Hypertension Maternal Grandfather    Social History   Social History  . Marital status: Married    Spouse name: N/A  . Number of children: N/A  . Years of education: N/A   Occupational History  . Not on file.   Social History Main Topics  . Smoking status: Current Every Day Smoker    Types: Cigars  . Smokeless tobacco: Never Used  . Alcohol use No  . Drug use: No  . Sexual activity: Yes    Partners: Male    Birth control/ protection: Surgical     Comment: BTL   Other Topics Concern  . Not on file   Social History Narrative  . No narrative on file     ROS as in subjective  Objective: BP (!) 146/90   Pulse 79   Wt 206 lb (93.4 kg)   SpO2 97%   BMI 37.08 kg/m   BP Readings from Last 3 Encounters:  12/31/15 (!) 146/90  10/17/15 120/78  10/01/15 (!) 186/110   Wt Readings from Last 3 Encounters:  12/31/15 206 lb (93.4 kg)  10/17/15 201 lb (91.2 kg)  10/01/15 204 lb (92.5 kg)   General appearance: alert, no distress, WD/WN Heart: RRR, normal S1, S2, no murmurs Lungs: CTA bilaterally, no wheezes, rhonchi, or rales Pulses: 2+ symmetric, upper and lower extremities, normal cap refill Ext: no edema Neuro: CN2-12 intact, non focal exam   Assessment: Encounter Diagnoses  Name Primary?  . Needs flu shot Yes  . Essential hypertension   . Hyperlipidemia, unspecified hyperlipidemia type   . Class 1 obesity with serious comorbidity in adult, unspecified BMI, unspecified obesity type   . Vitamin D deficiency   . Smoker     Plan: discussed diagnoses of hypertension, hyperlipidemia.   advised  she stop tobacco, c/t exercising, discussed diet, exercise, limiting salt, and lifestyle recommendations to improve on both BP and cholesterol.   C/t HCTZ.  She declines amlodipine today.  Will work on  lifestyle changes.   discussed goals of treatment, and risks of uncontrolled hypertension and cholesterol.  Counseled on smoking cessation.   C/t efforts at weight loss. Plan for fasting lipid and HgbA1C in 3-4 mo  Counseled on the influenza virus vaccine.  Vaccine information sheet given.  Influenza vaccine given after consent obtained.   Dois was seen today for meds check.  Diagnoses and all orders for this visit:  Needs flu shot -     Flu Vaccine QUAD 36+ mos IM  Essential hypertension -     Basic metabolic panel  Hyperlipidemia, unspecified hyperlipidemia type  Class 1 obesity with serious comorbidity in adult, unspecified BMI, unspecified obesity type  Vitamin D deficiency -     VITAMIN D 25 Hydroxy (Vit-D Deficiency, Fractures)  Smoker  Other orders -     hydrochlorothiazide (HYDRODIURIL) 25 MG tablet; Take 1 tablet (25 mg total) by mouth daily.

## 2015-12-31 NOTE — Patient Instructions (Signed)
Hypertension Hypertension, commonly called high blood pressure, is when the force of blood pumping through your arteries is too strong. Your arteries are the blood vessels that carry blood from your heart throughout your body. A blood pressure reading consists of a higher number over a lower number, such as 110/72. The higher number (systolic) is the pressure inside your arteries when your heart pumps. The lower number (diastolic) is the pressure inside your arteries when your heart relaxes. Ideally you want your blood pressure below 120/80. Hypertension forces your heart to work harder to pump blood. Your arteries may become narrow or stiff. Having hypertension puts you at risk for heart disease, stroke, and other problems.  RISK FACTORS Some risk factors for high blood pressure are controllable. Others are not.  Risk factors you cannot control include:   Race. You may be at higher risk if you are African American.  Age. Risk increases with age.  Gender. Men are at higher risk than women before age 37 years. After age 67, women are at higher risk than men. Risk factors you can control include:  Not getting enough exercise or physical activity.  Being overweight.  Getting too much fat, sugar, calories, or salt in your diet.  Drinking too much alcohol. SIGNS AND SYMPTOMS Hypertension does not usually cause signs or symptoms. Extremely high blood pressure (hypertensive crisis) may cause headache, anxiety, shortness of breath, and nosebleed. DIAGNOSIS  To check if you have hypertension, your health care provider will measure your blood pressure while you are seated, with your arm held at the level of your heart. It should be measured at least twice using the same arm. Certain conditions can cause a difference in blood pressure between your right and left arms. A blood pressure reading that is higher than normal on one occasion does not mean that you need treatment. If one blood pressure reading  is high, ask your health care provider about having it checked again. BLOOD PRESSURE STAGES Blood pressure is classified into four stages: normal, prehypertension, stage 1, and stage 2. Your blood pressure reading will be used to determine what type of treatment, if any, is necessary. Appropriate treatment options are tied to these four stages:  Normal  Systolic pressure (mm Hg): below 120.  Diastolic pressure (mm Hg): below 80. Prehypertension  Systolic pressure (mm Hg): 120 to 139.  Diastolic pressure (mm Hg): 80 to 89. Stage1  Systolic pressure (mm Hg): 140 to 159.  Diastolic pressure (mm Hg): 90 to 99. Stage2  Systolic pressure (mm Hg): 160 or above.  Diastolic pressure (mm Hg): 100 or above. RISKS RELATED TO HIGH BLOOD PRESSURE Managing your blood pressure is an important responsibility. Uncontrolled high blood pressure can lead to:  A heart attack.  A stroke.  A weakened blood vessel (aneurysm).  Heart failure.  Kidney damage.  Eye damage.  Metabolic syndrome.  Memory and concentration problems. TREATMENT  Treating high blood pressure includes making lifestyle changes and possibly taking medicine. Living a healthy lifestyle can help lower high blood pressure. You may need to change some of your habits. Lifestyle changes may include:  Following the DASH diet. This diet is high in fruits, vegetables, and whole grains. It is low in salt, red meat, and added sugars.  Getting at least 2 hours of brisk physical activity every week.  Losing weight if necessary.  Not smoking.  Limiting alcoholic beverages.  Learning ways to reduce stress. If lifestyle changes are not enough to get your blood pressure  under control, your health care provider may prescribe medicine. You may need to take more than one. Work closely with your health care provider to understand the risks and benefits. HOME CARE INSTRUCTIONS  Have your blood pressure rechecked as directed by  your health care provider.   Take medicines only as directed by your health care provider. Follow the directions carefully. Blood pressure medicines must be taken as prescribed. The medicine does not work as well when you skip doses. Skipping doses also puts you at risk for problems.   Do not smoke.   Monitor your blood pressure at home as directed by your health care provider. SEEK MEDICAL CARE IF:   You think you are having a reaction to medicines taken.  You have recurrent headaches or feel dizzy.  You have swelling in your ankles.  You have trouble with your vision. SEEK IMMEDIATE MEDICAL CARE IF:  You develop a severe headache or confusion.  You have unusual weakness, numbness, or feel faint.  You have severe chest or abdominal pain.  You vomit repeatedly.  You have trouble breathing. MAKE SURE YOU:   Understand these instructions.  Will watch your condition.  Will get help right away if you are not doing well or get worse. Document Released: 01/20/2005 Document Revised: 06/06/2013 Document Reviewed: 11/12/2012 [facility] Patient Information 2015 [place], [place]. This information is not intended to replace advice given to you by your health care provider. Make sure you discuss any questions you have with your health care provider.        DASH Eating Plan DASH stands for "Dietary Approaches to Stop Hypertension." The DASH eating plan is a healthy eating plan that has been shown to reduce high blood pressure (hypertension). Additional health benefits may include reducing the risk of type 2 diabetes mellitus, heart disease, and stroke. The DASH eating plan may also help with weight loss. WHAT DO I NEED TO KNOW ABOUT THE DASH EATING PLAN? For the DASH eating plan, you will follow these general guidelines:  Choose foods with a percent daily value for sodium of less than 5% (as listed on the food label).  Use salt-free seasonings or herbs instead of table salt or  sea salt.  Check with your health care provider or pharmacist before using salt substitutes.  Eat lower-sodium products, often labeled as "lower sodium" or "no salt added."  Eat fresh foods.  Eat more vegetables, fruits, and low-fat dairy products.  Choose whole grains. Look for the word "whole" as the first word in the ingredient list.  Choose fish and skinless chicken or [place] more often than red meat. Limit fish, poultry, and meat to 6 oz (170 g) each day.  Limit sweets, desserts, sugars, and sugary drinks.  Choose heart-healthy fats.  Limit cheese to 1 oz (28 g) per day.  Eat more home-cooked food and less restaurant, buffet, and fast food.  Limit fried foods.  Cook foods using methods other than frying.  Limit canned vegetables. If you do use them, rinse them well to decrease the sodium.  When eating at a restaurant, ask that your food be prepared with less salt, or no salt if possible. WHAT FOODS CAN I EAT? Seek help from a dietitian for individual calorie needs. Grains Whole grain or whole wheat bread. Brown rice. Whole grain or whole wheat pasta. Quinoa, bulgur, and whole grain cereals. Low-sodium cereals. Corn or whole wheat flour tortillas. Whole grain cornbread. Whole grain crackers. Low-sodium crackers. Vegetables Fresh or frozen vegetables (raw,  steamed, roasted, or grilled). Low-sodium or reduced-sodium tomato and vegetable juices. Low-sodium or reduced-sodium tomato sauce and paste. Low-sodium or reduced-sodium canned vegetables.  Fruits All fresh, canned (in natural juice), or frozen fruits. Meat and Other Protein Products Ground beef (85% or leaner), grass-fed beef, or beef trimmed of fat. Skinless chicken or [place]. Ground chicken or [place]. Pork trimmed of fat. All fish and seafood. Eggs. Dried beans, peas, or lentils. Unsalted nuts and seeds. Unsalted canned beans. Dairy Low-fat dairy products, such as skim or 1% milk, 2% or reduced-fat cheeses, low-fat  ricotta or cottage cheese, or plain low-fat yogurt. Low-sodium or reduced-sodium cheeses. Fats and Oils Tub margarines without trans fats. Light or reduced-fat mayonnaise and salad dressings (reduced sodium). Avocado. Safflower, olive, or canola oils. Natural peanut or almond butter. Other Unsalted popcorn and pretzels. The items listed above may not be a complete list of recommended foods or beverages. Contact your dietitian for more options. WHAT FOODS ARE NOT RECOMMENDED? Grains White bread. White pasta. White rice. Refined cornbread. Bagels and croissants. Crackers that contain trans fat. Vegetables Creamed or fried vegetables. Vegetables in a cheese sauce. Regular canned vegetables. Regular canned tomato sauce and paste. Regular tomato and vegetable juices. Fruits Dried fruits. Canned fruit in light or heavy syrup. Fruit juice. Meat and Other Protein Products Fatty cuts of meat. Ribs, chicken wings, bacon, sausage, bologna, salami, chitterlings, fatback, hot dogs, bratwurst, and packaged luncheon meats. Salted nuts and seeds. Canned beans with salt. Dairy Whole or 2% milk, cream, half-and-half, and cream cheese. Whole-fat or sweetened yogurt. Full-fat cheeses or blue cheese. Nondairy creamers and whipped toppings. Processed cheese, cheese spreads, or cheese curds. Condiments Onion and garlic salt, seasoned salt, table salt, and sea salt. Canned and packaged gravies. Worcestershire sauce. Tartar sauce. Barbecue sauce. Teriyaki sauce. Soy sauce, including reduced sodium. Steak sauce. Fish sauce. Oyster sauce. Cocktail sauce. Horseradish. Ketchup and mustard. Meat flavorings and tenderizers. Bouillon cubes. Hot sauce. Tabasco sauce. Marinades. Taco seasonings. Relishes. Fats and Oils Butter, stick margarine, lard, shortening, ghee, and bacon fat. Coconut, palm kernel, or palm oils. Regular salad dressings. Other Pickles and olives. Salted popcorn and pretzels. The items listed above may not  be a complete list of foods and beverages to avoid. Contact your dietitian for more information. WHERE CAN I FIND MORE INFORMATION? National Heart, Lung, and Blood Institute: travelstabloid.com Document Released: 01/09/2011 Document Revised: 06/06/2013 Document Reviewed: 11/24/2012 [facility] Patient Information 2015 [place], [place]. This information is not intended to replace advice given to you by your health care provider. Make sure you discuss any questions you have with your health care provider.        Why follow it? Research shows. . Those who follow the Mediterranean diet have a reduced risk of heart disease  . The diet is associated with a reduced incidence of Parkinson's and Alzheimer's diseases . People following the diet may have longer life expectancies and lower rates of chronic diseases  . The Dietary Guidelines for Americans recommends the Mediterranean diet as an eating plan to promote health and prevent disease  What Is the Mediterranean Diet?  . Healthy eating plan based on typical foods and recipes of Mediterranean-style cooking . The diet is primarily a plant based diet; these foods should make up a majority of meals   Starches - Plant based foods should make up a majority of meals - They are an important sources of vitamins, minerals, energy, antioxidants, and fiber - Choose whole grains, foods high in fiber and  minimally processed items  - Typical grain sources include wheat, oats, barley, corn, brown rice, bulgar, farro, millet, polenta, couscous  - Various types of beans include chickpeas, lentils, fava beans, black beans, white beans   Fruits  Veggies - Large quantities of antioxidant rich fruits & veggies; 6 or more servings  - Vegetables can be eaten raw or lightly drizzled with oil and cooked  - Vegetables common to the traditional Mediterranean Diet include: artichokes, arugula, beets, broccoli, brussel sprouts, cabbage, carrots,  celery, collard greens, cucumbers, eggplant, kale, leeks, lemons, lettuce, mushrooms, okra, onions, peas, peppers, potatoes, pumpkin, radishes, rutabaga, shallots, spinach, sweet potatoes, turnips, zucchini - Fruits common to the Mediterranean Diet include: apples, apricots, avocados, cherries, clementines, dates, figs, grapefruits, grapes, melons, nectarines, oranges, peaches, pears, pomegranates, strawberries, tangerines  Fats - Replace butter and margarine with healthy oils, such as olive oil, canola oil, and tahini  - Limit nuts to no more than a handful a day  - Nuts include walnuts, almonds, pecans, pistachios, pine nuts  - Limit or avoid candied, honey roasted or heavily salted nuts - Olives are central to the [employer] - can be eaten whole or used in a variety of dishes   Meats Protein - Limiting red meat: no more than a few times a month - When eating red meat: choose lean cuts and keep the portion to the size of deck of cards - Eggs: approx. 0 to 4 times a week  - Fish and lean poultry: at least 2 a week  - Healthy protein sources include, chicken, [place], lean beef, lamb - Increase intake of seafood such as tuna, salmon, trout, mackerel, shrimp, scallops - Avoid or limit high fat processed meats such as sausage and bacon  Dairy - Include moderate amounts of low fat dairy products  - Focus on healthy dairy such as fat free yogurt, skim milk, low or reduced fat cheese - Limit dairy products higher in fat such as whole or 2% milk, cheese, ice cream  Alcohol - Moderate amounts of red wine is ok  - No more than 5 oz daily for women (all ages) and men older than age 67  - No more than 10 oz of wine daily for men younger than 29  Other - Limit sweets and other desserts  - Use herbs and spices instead of salt to flavor foods  - Herbs and spices common to the traditional Mediterranean Diet include: basil, bay leaves, chives, cloves, cumin, fennel, garlic, lavender, marjoram, mint,  oregano, parsley, pepper, rosemary, sage, savory, sumac, tarragon, thyme   It's not just a diet, it's a lifestyle:  . The Mediterranean diet includes lifestyle factors typical of those in the region  . Foods, drinks and meals are best eaten with others and savored . Daily physical activity is important for overall good health . This could be strenuous exercise like running and aerobics . This could also be more leisurely activities such as walking, housework, yard-work, or taking the stairs . Moderation is the key; a balanced and healthy diet accommodates most foods and drinks . Consider portion sizes and frequency of consumption of certain foods   Meal Ideas & Options:  . Breakfast:  o Whole wheat toast or whole wheat English muffins with peanut butter & hard boiled egg o Steel cut oats topped with apples & cinnamon and skim milk  o Fresh fruit: banana, strawberries, melon, berries, peaches  o Smoothies: strawberries, bananas, greek yogurt, peanut butter o Low fat greek  yogurt with blueberries and granola  o Egg white omelet with spinach and mushrooms o Breakfast couscous: whole wheat couscous, apricots, skim milk, cranberries  . Sandwiches:  o Hummus and grilled vegetables (peppers, zucchini, squash) on whole wheat bread   o Grilled chicken on whole wheat pita with lettuce, tomatoes, cucumbers or tzatziki  o Tuna salad on whole wheat bread: tuna salad made with greek yogurt, olives, red peppers, capers, green onions o Garlic rosemary lamb pita: lamb sauted with garlic, rosemary, salt & pepper; add lettuce, cucumber, greek yogurt to pita - flavor with lemon juice and black pepper  . Seafood:  o Mediterranean grilled salmon, seasoned with garlic, basil, parsley, lemon juice and black pepper o Shrimp, lemon, and spinach whole-grain pasta salad made with low fat greek yogurt  o Seared scallops with lemon orzo  o Seared tuna steaks seasoned salt, pepper, coriander topped with tomato  mixture of olives, tomatoes, olive oil, minced garlic, parsley, green onions and cappers  . Meats:  o Herbed greek chicken salad with kalamata olives, cucumber, feta  o Red bell peppers stuffed with spinach, bulgur, lean ground beef (or lentils) & topped with feta   o Kebabs: skewers of chicken, tomatoes, onions, zucchini, squash  o [place] burgers: made with red onions, mint, dill, lemon juice, feta cheese topped with roasted red peppers . Vegetarian o Cucumber salad: cucumbers, artichoke hearts, celery, red onion, feta cheese, tossed in olive oil & lemon juice  o Hummus and whole grain pita points with a greek salad (lettuce, tomato, feta, olives, cucumbers, red onion) o Lentil soup with celery, carrots made with vegetable broth, garlic, salt and pepper  o Tabouli salad: parsley, bulgur, mint, scallions, cucumbers, tomato, radishes, lemon juice, olive oil, salt and pepper.

## 2016-05-19 ENCOUNTER — Ambulatory Visit: Payer: Commercial Managed Care - PPO | Admitting: Medical

## 2016-07-02 ENCOUNTER — Ambulatory Visit (INDEPENDENT_AMBULATORY_CARE_PROVIDER_SITE_OTHER): Payer: Commercial Managed Care - PPO | Admitting: Medical

## 2016-07-02 ENCOUNTER — Encounter: Payer: Self-pay | Admitting: Medical

## 2016-07-02 VITALS — BP 136/80 | HR 77 | Wt 195.4 lb

## 2016-07-03 NOTE — Progress Notes (Signed)
Of note, patient left before provider could get into room.  She literally waited 97min and left.    BP 136/80   Pulse 77   Wt 195 lb 6.4 oz (88.6 kg)   SpO2 98%   BMI 35.17 kg/m   Wt Readings from Last 3 Encounters:  07/02/16 195 lb 6.4 oz (88.6 kg)  12/31/15 206 lb (93.4 kg)  10/17/15 201 lb (91.2 kg)

## 2016-10-17 ENCOUNTER — Ambulatory Visit: Payer: Commercial Managed Care - PPO | Admitting: Certified Nurse Midwife

## 2016-11-28 ENCOUNTER — Encounter: Payer: Self-pay | Admitting: Obstetrics and Gynecology

## 2016-11-28 ENCOUNTER — Ambulatory Visit: Payer: Commercial Managed Care - PPO | Admitting: Obstetrics and Gynecology

## 2016-11-28 VITALS — BP 130/82 | HR 80 | Resp 16 | Ht 62.0 in | Wt 193.8 lb

## 2016-11-28 DIAGNOSIS — Z01419 Encounter for gynecological examination (general) (routine) without abnormal findings: Secondary | ICD-10-CM | POA: Diagnosis not present

## 2016-11-28 NOTE — Progress Notes (Signed)
46 y.o. [id number] Married [place] American female here for annual exam.    Menses:  2 cycles in June.  Normal cycle in July, August, September, and October.   PCP:  Chana Bode, PA-C   Patient's last menstrual period was 11/22/2016 (exact date).     Period Cycle (Days):  (30) Period Duration (Days): 5-7days Period Pattern: Regular Menstrual Flow:  (first 2 days heavy then tapers) Menstrual Control: Maxi pad Menstrual Control Change Freq (Hours): every 2 hours first 2 days of cycle(heaviest days) Dysmenorrhea: (!) Severe Dysmenorrhea Symptoms: Cramping, Headache     Sexually active: Yes.   female The current method of family planning is tubal ligation.    Exercising: No.   Smoker:  no  Health Maintenance: Pap: 10-17-15 Neg, 10-05-14 Neg:Neg HR HPV. History of abnormal Pap:  Yes, 1992 Hx of LEEP MMG: 10-26-15 Density C/Neg/BiRads1:TBC.   Colonoscopy:  N/a. BMD:   n/a  Result  n/a TDaP:  2015 Gardasil:   no HIV: done during pregnancy Hep C:unsure Screening Labs:  Hb today:PCP, Urine today: not done   reports that she has been smoking Cigars.  She has never used smokeless tobacco. She reports that she does not drink alcohol or use drugs.  Past Medical History:  Diagnosis Date  . Abnormal Pap smear 1992 2014   1992 Leep, 02/2012 ASCUS HPV neg  . HPV in female   . Hypertension   . Miscarriage     Past Surgical History:  Procedure Laterality Date  . CERVICAL BIOPSY  W/ LOOP ELECTRODE EXCISION    . CESAREAN SECTION N/A 07/19/2013   Procedure: CESAREAN SECTION;  Surgeon: Allyn Kenner, DO;  Location: [facility];  Service: Obstetrics;  Laterality: N/A;  . KNEE ARTHROCENTESIS  plate and screws  . TUBAL LIGATION      Current Outpatient Prescriptions  Medication Sig Dispense Refill  . Acetaminophen (TYLENOL PO) Take by mouth 3 times/day as needed-between meals & bedtime.    . cholecalciferol (VITAMIN D) 1000 units tablet Take 1,000 Units by mouth daily.    . DiphenhydrAMINE HCl  (BENADRYL PO) Take by mouth as needed.    . hydrochlorothiazide (HYDRODIURIL) 25 MG tablet Take 1 tablet (25 mg total) by mouth daily. 90 tablet 3  . Multiple Vitamins-Calcium (ONE-A-DAY WOMENS PO) Take 1 capsule by mouth daily.     No current facility-administered medications for this visit.     Family History  Problem Relation Age of Onset  . Hypertension Mother   . Diabetes Mother   . Breast cancer Mother 20  . Stroke Father 76       smoker  . Hypertension Father   . Hypertension Maternal Grandmother   . Hypertension Maternal Grandfather     ROS:  Pertinent items are noted in HPI.  Otherwise, a comprehensive ROS was negative.  Exam:   BP 130/82 (BP Location: Right Arm, Patient Position: Sitting, Cuff Size: Large)   Pulse 80   Resp 16   Ht '5\' 2"'  (1.575 m)   Wt 193 lb 12.8 oz (87.9 kg)   LMP 11/22/2016 (Exact Date)   BMI 35.45 kg/m     General appearance: alert, cooperative and appears stated age Head: Normocephalic, without obvious abnormality, atraumatic Neck: no adenopathy, supple, symmetrical, trachea midline and thyroid normal to inspection and palpation Lungs: clear to auscultation bilaterally Breasts: normal appearance, no masses or tenderness, No nipple retraction or dimpling, No nipple discharge or bleeding, No axillary or supraclavicular adenopathy Heart: regular rate and rhythm Abdomen:  soft, non-tender; no masses, no organomegaly Extremities: extremities normal, atraumatic, no cyanosis or edema Skin: Skin color, texture, turgor normal. No rashes or lesions Lymph nodes: Cervical, supraclavicular, and axillary nodes normal. No abnormal inguinal nodes palpated Neurologic: Grossly normal  Pelvic: External genitalia:  no lesions              Urethra:  normal appearing urethra with no masses, tenderness or lesions              Bartholins and Skenes: normal                 Vagina: normal appearing vagina with normal color and discharge, no lesions               Cervix: no lesions.  Dark blood noted.              Pap taken: No. Bimanual Exam:  Uterus:  normal size, contour, position, consistency, mobility, non-tender              Adnexa: no mass, fullness, tenderness              Rectal exam: Yes.  .  Confirms.              Anus:  normal sphincter tone, no lesions  Chaperone was present for exam.  Assessment:   Well woman visit with normal exam. Hx BTL.  Hx LEEP.  FH of breast cancer in mother age 71.  Patient had negative BRCA testing.   Plan: Mammogram screening discussed. Recommended self breast awareness. Pap and HR HPV as above. Call for irregular menses.  Guidelines for Calcium, Vitamin D, regular exercise program including cardiovascular and weight bearing exercise. I discussed Gardasil.  She will consider.  Colonoscopy for next year discussed.  Labs with PCP.   Flu vaccine recommended. Follow up annually and prn.      After visit summary provided.

## 2016-11-28 NOTE — Patient Instructions (Signed)

## 2016-12-01 ENCOUNTER — Other Ambulatory Visit: Payer: Self-pay | Admitting: Obstetrics and Gynecology

## 2016-12-01 DIAGNOSIS — Z1231 Encounter for screening mammogram for malignant neoplasm of breast: Secondary | ICD-10-CM

## 2016-12-22 ENCOUNTER — Ambulatory Visit
Admission: RE | Admit: 2016-12-22 | Discharge: 2016-12-22 | Disposition: A | Discharge status: Home / Self Care | Payer: Commercial Managed Care - PPO | Source: Ambulatory Visit | Attending: Obstetrics and Gynecology | Admitting: Obstetrics and Gynecology

## 2016-12-22 DIAGNOSIS — Z1231 Encounter for screening mammogram for malignant neoplasm of breast: Secondary | ICD-10-CM

## 2017-01-08 ENCOUNTER — Other Ambulatory Visit: Payer: Self-pay | Admitting: Medical

## 2017-01-08 NOTE — Telephone Encounter (Signed)
Called and l/m for pt call us back to set up appt for refill

## 2017-02-13 ENCOUNTER — Encounter: Payer: Self-pay | Admitting: Medical

## 2017-02-13 ENCOUNTER — Ambulatory Visit: Payer: Commercial Managed Care - PPO | Admitting: Medical

## 2017-02-13 VITALS — BP 146/80 | HR 96 | Wt 199.6 lb

## 2017-02-13 DIAGNOSIS — E669 Obesity, unspecified: Secondary | ICD-10-CM | POA: Diagnosis not present

## 2017-02-13 DIAGNOSIS — F17200 Nicotine dependence, unspecified, uncomplicated: Secondary | ICD-10-CM | POA: Diagnosis not present

## 2017-02-13 DIAGNOSIS — E785 Hyperlipidemia, unspecified: Secondary | ICD-10-CM

## 2017-02-13 DIAGNOSIS — I10 Essential (primary) hypertension: Secondary | ICD-10-CM

## 2017-02-13 DIAGNOSIS — Z23 Encounter for immunization: Secondary | ICD-10-CM | POA: Diagnosis not present

## 2017-02-13 DIAGNOSIS — E66811 Obesity, class 1: Secondary | ICD-10-CM

## 2017-02-13 DIAGNOSIS — E559 Vitamin D deficiency, unspecified: Secondary | ICD-10-CM | POA: Diagnosis not present

## 2017-02-13 MED ORDER — HYDROCHLOROTHIAZIDE 25 MG PO TABS: 25.0000 mg | ORAL_TABLET | Freq: Every day | ORAL | 3 refills | Status: DC

## 2017-02-13 NOTE — Progress Notes (Signed)
  Subjective:  Bridget Fields is a 47 y.o. female who presents for med check.  HTN - ran out of medication HCTZ a month ago.   Gets 130/80s in the mornings, 140/80s in afternoon.  Not exercising.   Trying to eat healthy.    Still smoking.  non fasting today.     She is up to date seeing gynecology, had recent  Mammogram.  No other aggravating or relieving factors.    No other c/o.  The following portions of the patient's history were reviewed and updated as appropriate: allergies, current medications, past family history, past medical history, past social history, past surgical history and problem list.  ROS Otherwise as in subjective above  Objective: BP (!) 146/80   Pulse 96   Wt 199 lb 9.6 oz (90.5 kg)   SpO2 97%   BMI 36.51 kg/m   General appearance: alert, no distress, WD/WN Neck: supple, no lymphadenopathy, no thyromegaly, no masses Heart: RRR, normal S1, S2, no murmurs Lungs: CTA bilaterally, no wheezes, rhonchi, or rales Abdomen: +bs, soft, non tender, non distended, no masses, no hepatomegaly, no splenomegaly Pulses: 2+ radial pulses, 2+ pedal pulses, normal cap refill Ext: no edema   Assessment: Encounter Diagnoses  Name Primary?  . Essential hypertension, benign Yes  . Need for influenza vaccination   . Smoker   . Vitamin D deficiency   . Hyperlipidemia, unspecified hyperlipidemia type   . Class 1 obesity with serious comorbidity in adult, unspecified BMI, unspecified obesity type      Plan: HTN - c/t HCTZ, work on lifestyle changes, weight loss.  Return next week for fasting labs.    She will return 02/23/17 for update EKG with nurse visit  Smoking - advised cessation.  She is considering cessation.  Vit D deficiency - c/t Vit D OTC  hyperlipidemia - f/u for fasting labs  Obesity - advised weight loss, counseled on lifestyle changes  Counseled on the influenza virus vaccine.  Vaccine  information sheet given.  Influenza vaccine given after consent obtained.  Follow up: next week for fasting labs  [name] was seen today for follow-up.  Diagnoses and all orders for this visit:  Essential hypertension, benign -     Comprehensive metabolic panel; Future -     CBC; Future -     Hemoglobin A1c; Future -     Lipid panel; Future -     VITAMIN D 25 Hydroxy (Vit-D Deficiency, Fractures); Future  Need for influenza vaccination -     Flu Vaccine QUAD 36+ mos IM  Smoker  Vitamin D deficiency -     VITAMIN D 25 Hydroxy (Vit-D Deficiency, Fractures); Future  Hyperlipidemia, unspecified hyperlipidemia type -     Lipid panel; Future  Class 1 obesity with serious comorbidity in adult, unspecified BMI, unspecified obesity type -     Hemoglobin A1c; Future  Other orders -     hydrochlorothiazide (HYDRODIURIL) 25 MG tablet; Take 1 tablet (25 mg total) by mouth daily.

## 2017-02-23 ENCOUNTER — Other Ambulatory Visit (INDEPENDENT_AMBULATORY_CARE_PROVIDER_SITE_OTHER): Payer: Commercial Managed Care - PPO

## 2017-02-23 DIAGNOSIS — E669 Obesity, unspecified: Secondary | ICD-10-CM

## 2017-02-23 DIAGNOSIS — E559 Vitamin D deficiency, unspecified: Secondary | ICD-10-CM

## 2017-02-23 DIAGNOSIS — I10 Essential (primary) hypertension: Secondary | ICD-10-CM

## 2017-02-23 DIAGNOSIS — E785 Hyperlipidemia, unspecified: Secondary | ICD-10-CM

## 2017-02-24 ENCOUNTER — Telehealth: Payer: Self-pay

## 2017-02-24 ENCOUNTER — Other Ambulatory Visit: Payer: Self-pay | Admitting: Medical

## 2017-02-24 LAB — LIPID PANEL
CHOL/HDL RATIO: 3.5 ratio (ref 0.0–4.4)
Cholesterol, Total: 249 mg/dL — ABNORMAL HIGH (ref 100–199)
HDL: 72 mg/dL (ref >39)
LDL CALC: 161 mg/dL — AB (ref 0–99)
Triglycerides: 81 mg/dL (ref 0–149)
VLDL CHOLESTEROL CAL: 16 mg/dL (ref 5–40)

## 2017-02-24 LAB — CBC
HEMATOCRIT: 42.7 % (ref 34.0–46.6)
HEMOGLOBIN: 14 g/dL (ref 11.1–15.9)
MCH: 30.8 pg (ref 26.6–33.0)
MCHC: 32.8 g/dL (ref 31.5–35.7)
MCV: 94 fL (ref 79–97)
Platelets: 357 10*3/uL (ref 150–379)
RBC: 4.54 x10E6/uL (ref 3.77–5.28)
RDW: 13.2 % (ref 12.3–15.4)
WBC: 7.7 10*3/uL (ref 3.4–10.8)

## 2017-02-24 LAB — COMPREHENSIVE METABOLIC PANEL
A/G RATIO: 1.4 (ref 1.2–2.2)
ALT: 11 IU/L (ref 0–32)
AST: 17 IU/L (ref 0–40)
Albumin: 4.2 g/dL (ref 3.5–5.5)
Alkaline Phosphatase: 67 IU/L (ref 39–117)
BUN/Creatinine Ratio: 10 (ref 9–23)
BUN: 8 mg/dL (ref 6–24)
Bilirubin Total: 0.6 mg/dL (ref 0.0–1.2)
CALCIUM: 9.6 mg/dL (ref 8.7–10.2)
CHLORIDE: 103 mmol/L (ref 96–106)
CO2: 22 mmol/L (ref 20–29)
Creatinine, Ser: 0.81 mg/dL (ref 0.57–1.00)
GFR, EST AFRICAN AMERICAN: 102 mL/min/{1.73_m2} (ref >59)
GFR, EST NON AFRICAN AMERICAN: 89 mL/min/{1.73_m2} (ref >59)
Globulin, Total: 2.9 g/dL (ref 1.5–4.5)
Glucose: 94 mg/dL (ref 65–99)
POTASSIUM: 4.1 mmol/L (ref 3.5–5.2)
Sodium: 140 mmol/L (ref 134–144)
TOTAL PROTEIN: 7.1 g/dL (ref 6.0–8.5)

## 2017-02-24 LAB — HEMOGLOBIN A1C
ESTIMATED AVERAGE GLUCOSE: 88 mg/dL
HEMOGLOBIN A1C: 4.7 % — AB (ref 4.8–5.6)

## 2017-02-24 LAB — VITAMIN D 25 HYDROXY (VIT D DEFICIENCY, FRACTURES): Vit D, 25-Hydroxy: 36.8 ng/mL (ref 30.0–100.0)

## 2017-02-24 MED ORDER — PRAVASTATIN SODIUM 20 MG PO TABS: 20.0000 mg | ORAL_TABLET | Freq: Every evening | ORAL | 0 refills | Status: DC

## 2017-02-24 NOTE — Telephone Encounter (Signed)
Pt called very upset about a medicine sent in before a called placed to pt concerning labs. Pt would not allow me to continue with note from [place] and requested a copy of her labs be sent to her. Labs are going out tomorrow due to mail already running. Thanks [employer]

## 2017-03-11 ENCOUNTER — Telehealth: Payer: Self-pay | Admitting: Medical

## 2017-03-11 NOTE — Telephone Encounter (Signed)
Beverlee Nims - I wanted to bring this to your attention in case you want to call her.   I saw this person recently for med check and given her risk factors for heart disease decided to place her on statin.  I had sent Tarsha lab results with recommendations to discuss with patient when she called the patient.  I had also already sent the Pravachol cholesterol medicine to the pharmacy.  Given the conversation I had the day before with the patient in the office, I assumed she trusted my recommendations.  Thus when I sent the recommendations to Louretta Shorten I had simultaneously sent out Pravachol to your pharmacy without verifying she was agreeable to the medicine.  Current guidelines or recommendations suggest she needs to be on a statin such as Pravachol.   As it turns out her pharmacy had contacted her about a medication ready for her to pick up before Louretta Shorten had called her with the results.  This was inadvertent but she apparently did not like this.  So she decided to go on [employer] and give [place] a bad review.  I did not realize she had given the review until just now.  Nevertheless we had called her back that day and explained to her that we generally do get their agreement before we send the medication, but I was saving Louretta Shorten time and energy on the phone, and I was also saving the patient another visit to come in and discuss.  Some offices nitpick and make the patient come in for every little thing.  I do not feel that we do this  We had already apologized for the oversight that day on the phone, but if she wants to handle her dislikes by posting on [employer], then I don't care to see her as a patient.

## 2017-03-12 ENCOUNTER — Encounter: Payer: Self-pay | Admitting: Family Medicine

## 2017-03-12 NOTE — Telephone Encounter (Signed)
Dismissal letter sent.

## 2017-03-26 ENCOUNTER — Telehealth: Payer: Self-pay | Admitting: Family Medicine

## 2017-03-26 NOTE — Telephone Encounter (Signed)
Patient called and left a message for [place] to call her and tell her why we dismissed her before she calls her attorney.

## 2017-03-27 NOTE — Telephone Encounter (Signed)
I called and left message for her to call back.  03/27/17   See below: Of note, I saw Bridget Fields back on 02/13/17 for routine visit.  She has a history of high blood pressure, high cholesterol, tobacco use, obesity, and family history of hypertension throughout, and father with a history of stroke.  When I saw her her in January she came back fasting for labs on 02/23/17.  The next morning I had put in my comments for her lab results to my nurse.  1 of my recommendations was for her to go on a statin to lower her heart disease risk as she was not on a statin.  I had sent the medication to the pharmacy assuming she would be agreeable to the treatment.  And apparently she had a message from the pharmacy that her medication was ready for pickup before we had a chance to call her about her lab results the chart documentation shows that  The patient had called in here around 11:48 in the morning on January 22 after I had sent a result message to my [place].  CMA tried to call her and left a message later that day at 2:06 PM.    Bridget Fields ended up talking to her the next day at 2:18 PM in the afternoon.  It was brought to my attention that she was upset that the medication was sent before she was aware of the results.  This was inadvertent and not meant to cause alarm.  We expressed that to her on the phone and I asked Bridget Fields to let her know that I apologize for that   I thought the situation was resolved at that time as she was agreeable to the medication.    However I happened to look up our practice information on [employer] recently and discovered that she had placed a negative review on [employer] about a month ago which would be not long after the phone call above took place.  Once I discovered this I talked to my office manager about this, and I felt that this was done by Bridget Fields and poor taste, even after we had discussed this with her on the phone.  She decided to publicly slender [place] on [employer] review.  At  that point I felt like we could not have a meaningful trusting relationship if this is how she is going to resolve her frustration.  Thus we made a decision to dismiss her from the practice  We then got a phone call yesterday 03/26/17 from her.

## 2017-03-30 NOTE — Telephone Encounter (Signed)
Bridget Fields, I tried to call her Friday 3 times, and I left a message.  I disucssed this with Dr. Redmond School as well.  I can't take her call if she calls back, or if you want to call her, that is fine too.  I didn't have a chance to call her Monday/yesterday

## 2017-04-03 NOTE — Telephone Encounter (Signed)
Bridget Fields, I tried calling her last week, just simply haven't had a chance to call today.  Please read my reply though.

## 2017-11-23 ENCOUNTER — Other Ambulatory Visit: Payer: Self-pay | Admitting: Obstetrics and Gynecology

## 2017-11-23 DIAGNOSIS — Z1231 Encounter for screening mammogram for malignant neoplasm of breast: Secondary | ICD-10-CM

## 2017-12-07 ENCOUNTER — Ambulatory Visit: Payer: Commercial Managed Care - PPO | Admitting: Obstetrics and Gynecology

## 2017-12-14 ENCOUNTER — Other Ambulatory Visit: Payer: Self-pay

## 2017-12-14 ENCOUNTER — Encounter: Payer: Self-pay | Admitting: Obstetrics and Gynecology

## 2017-12-14 ENCOUNTER — Ambulatory Visit: Payer: BLUE CROSS/BLUE SHIELD | Admitting: Obstetrics and Gynecology

## 2017-12-14 VITALS — BP 146/100 | HR 80 | Resp 18 | Ht 62.0 in | Wt 204.0 lb

## 2017-12-14 DIAGNOSIS — E034 Atrophy of thyroid (acquired): Secondary | ICD-10-CM | POA: Diagnosis not present

## 2017-12-14 DIAGNOSIS — Z1211 Encounter for screening for malignant neoplasm of colon: Secondary | ICD-10-CM | POA: Diagnosis not present

## 2017-12-14 DIAGNOSIS — Z01419 Encounter for gynecological examination (general) (routine) without abnormal findings: Secondary | ICD-10-CM

## 2017-12-14 NOTE — Progress Notes (Signed)
pls remove me as PCP as she has been dismissed

## 2017-12-14 NOTE — Patient Instructions (Signed)

## 2017-12-14 NOTE — Progress Notes (Signed)
47 y.o. [id number] Married [place] American female here for annual exam.    3 - 4 times a year, has 2 menses per month.  The last time this occurred was August, 2 and then August 14.  Menses last 7 days.  Has cramping and Tylenol helps but she takes it every 3 hours.  States she eats Tylenol like candy.   States her menstruation is manageable currently.   Breaking her HCTZ in 1/2 for the last 6 months.   PCP:  None.  She will [facility].   Patient's last menstrual period was 11/30/2017.     Period Cycle (Days): 30 Period Duration (Days): 7 days Period Pattern: Regular Menstrual Flow: (heavy first 3-4 then tapers) Menstrual Control: Maxi pad Menstrual Control Change Freq (Hours): every 2 hours on heaviest day Dysmenorrhea: (!) Moderate Dysmenorrhea Symptoms: Cramping, Headache     Sexually active: Yes.   female The current method of family planning is tubal ligation.    Exercising: No.   Smoker:  no  Health Maintenance: Pap:  10-17-15 Neg, 10-05-14 Neg:Neg HR HPV History of abnormal Pap:  Yes, 1992 hx of LEEP MMG:  12-22-16 Neg/density C/BiRads1--appt. 01-04-18 Colonoscopy:  n/a BMD:   n/a  Result  n/a TDaP:  2015 Gardasil:   no HIV: during pregnancy Hep C: Unsure Screening Labs:  Will do with PCP.    reports that she has been smoking cigars. She has never used smokeless tobacco. She reports that she does not drink alcohol or use drugs.  Past Medical History:  Diagnosis Date  . Abnormal Pap smear 1992 2014   1992 Leep, 02/2012 ASCUS HPV neg  . HPV in female   . Hyperlipidemia   . Hypertension   . Miscarriage     Past Surgical History:  Procedure Laterality Date  . CERVICAL BIOPSY  W/ LOOP ELECTRODE EXCISION    . CESAREAN SECTION N/A 07/19/2013   Procedure: CESAREAN SECTION;  Surgeon: Allyn Kenner, DO;  Location: [facility];  Service: Obstetrics;  Laterality: N/A;  . KNEE ARTHROCENTESIS  plate and screws  . TUBAL LIGATION      Current  Outpatient Medications  Medication Sig Dispense Refill  . Acetaminophen (TYLENOL PO) Take by mouth 3 times/day as needed-between meals & bedtime.    . cholecalciferol (VITAMIN D) 1000 units tablet Take 1,000 Units by mouth daily.    . DiphenhydrAMINE HCl (BENADRYL PO) Take by mouth as needed.    . hydrochlorothiazide (HYDRODIURIL) 25 MG tablet Take 1 tablet (25 mg total) by mouth daily. (Patient not taking: Reported on 12/14/2017) 90 tablet 3  . Multiple Vitamins-Calcium (ONE-A-DAY WOMENS PO) Take 1 capsule by mouth daily.    . pravastatin (PRAVACHOL) 20 MG tablet Take 1 tablet (20 mg total) by mouth every evening. (Patient not taking: Reported on 12/14/2017) 90 tablet 0   No current facility-administered medications for this visit.     Family History  Problem Relation Age of Onset  . Hypertension Mother   . Diabetes Mother   . Breast cancer Mother 78  . Cancer Mother   . Stroke Father 3       smoker  . Hypertension Father   . Hypertension Maternal Grandmother   . Hypertension Maternal Grandfather   . Hypertension Sister   . Hearing loss Neg Hx     Review of Systems  All other systems reviewed and are negative.   Exam:   BP (!) 146/100 (BP Location: Right Arm, Patient Position:  Sitting, Cuff Size: Large)   Pulse 80   Resp 18   Ht '5\' 2"'  (1.575 m)   Wt 204 lb (92.5 kg)   LMP 11/30/2017   BMI 37.31 kg/m     General appearance: alert, cooperative and appears stated age Head: Normocephalic, without obvious abnormality, atraumatic Neck: no adenopathy, supple, symmetrical, trachea midline and thyroid normal to inspection and palpation Lungs: clear to auscultation bilaterally Breasts: normal appearance, no masses or tenderness, No nipple retraction or dimpling, No nipple discharge or bleeding, No axillary or supraclavicular adenopathy Heart: regular rate and rhythm Abdomen: soft, non-tender; no masses, no organomegaly Extremities: extremities normal, atraumatic, no cyanosis or  edema Skin: Skin color, texture, turgor normal. No rashes or lesions Lymph nodes: Cervical, supraclavicular, and axillary nodes normal. No abnormal inguinal nodes palpated Neurologic: Grossly normal  Pelvic: External genitalia:  no lesions              Urethra:  normal appearing urethra with no masses, tenderness or lesions              Bartholins and Skenes: normal                 Vagina: normal appearing vagina with normal color and discharge, no lesions              Cervix: no lesions.  Mild blood noted.               Pap taken: No. Bimanual Exam:  Uterus:  6 week size, firm to touch.              Adnexa: no mass, fullness, tenderness              Rectal exam: Yes.  .  Confirms.              Anus:  normal sphincter tone, no lesions  Chaperone was present for exam.  Assessment:   Well woman visit with normal exam. Hx BTL.  Hx LEEP.  Fibroids on exam.  Irregular cycles.  Periodic. Painful menstruation.  FH of breast cancer in mother age 92.  Patient had negative BRCA testing.  Smoker.   Hypertension.  Did not take Rx today. Hypercholesterolemia.    Plan: Mammogram screening. Recommended self breast awareness. Pap and HR HPV as above. Guidelines for Calcium, Vitamin D, regular exercise program including cardiovascular and weight bearing exercise. Discussed Micronor or Mirena IUD to help menses.  She will consider.  I would favor an ultrasound first prior to Mirena IUD.  Check TSH.  IFOB. She will refill her meds and do general labs with her PCP.   Follow up annually and prn.   After visit summary provided.

## 2017-12-15 LAB — TSH: TSH: 1.59 u[IU]/mL (ref 0.450–4.500)

## 2017-12-24 DIAGNOSIS — Z1211 Encounter for screening for malignant neoplasm of colon: Secondary | ICD-10-CM | POA: Diagnosis not present

## 2017-12-26 LAB — SPECIMEN STATUS REPORT

## 2017-12-26 LAB — FECAL OCCULT BLOOD, IMMUNOCHEMICAL: FECAL OCCULT BLD: NEGATIVE

## 2018-01-04 ENCOUNTER — Ambulatory Visit
Admission: RE | Admit: 2018-01-04 | Discharge: 2018-01-04 | Disposition: A | Discharge status: Home / Self Care | Payer: BLUE CROSS/BLUE SHIELD | Source: Ambulatory Visit | Attending: Obstetrics and Gynecology | Admitting: Obstetrics and Gynecology

## 2018-01-04 DIAGNOSIS — Z1231 Encounter for screening mammogram for malignant neoplasm of breast: Secondary | ICD-10-CM

## 2018-01-22 ENCOUNTER — Encounter: Payer: Self-pay | Admitting: Obstetrics and Gynecology

## 2018-03-22 DIAGNOSIS — E7800 Pure hypercholesterolemia, unspecified: Secondary | ICD-10-CM | POA: Diagnosis not present

## 2018-03-22 DIAGNOSIS — I10 Essential (primary) hypertension: Secondary | ICD-10-CM | POA: Diagnosis not present

## 2018-08-20 DIAGNOSIS — I10 Essential (primary) hypertension: Secondary | ICD-10-CM | POA: Diagnosis not present

## 2018-08-20 DIAGNOSIS — E7800 Pure hypercholesterolemia, unspecified: Secondary | ICD-10-CM | POA: Diagnosis not present

## 2018-09-03 DIAGNOSIS — E7800 Pure hypercholesterolemia, unspecified: Secondary | ICD-10-CM | POA: Diagnosis not present

## 2018-09-03 DIAGNOSIS — I10 Essential (primary) hypertension: Secondary | ICD-10-CM | POA: Diagnosis not present

## 2018-12-29 ENCOUNTER — Other Ambulatory Visit: Payer: Self-pay

## 2018-12-29 NOTE — Progress Notes (Signed)
48 y.o. Bridget Fields Married [place] American female here for annual exam.    Menses are regular.  On cycle today.  Painful.  Takes Ibuprofen and this helps.   HYQ:[facility]     Patient's last menstrual period was 12/31/2018 (exact date).           Sexually active: Yes.    The current method of family planning is tubal ligation.    Exercising: Yes.    walks 6 miles/week Smoker:  no  Health Maintenance: Pap: 10-17-15 Neg, 10-05-14 Neg:Neg HR HPV, Jan 2014:  ASCUS, neg HR HPV.  History of abnormal Pap:  Yes, 1992 hx of LEEP MMG: 01-04-18 Neg/density C/Birads1--she will call to schedule Colonoscopy:  n/a BMD:   n/a  Result  n/a TDaP:  2015 Gardasil:   no HIV:Neg during pregnancy Hep C:Unsure Screening Labs:  PCP. Flu vaccine:  Recommended.    reports that she quit smoking about 9 months ago. Her smoking use included cigars. She has never used smokeless tobacco. She reports that she does not drink alcohol or use drugs.  Past Medical History:  Diagnosis Date  . Abnormal Pap smear 1992 2014   1992 Leep, 02/2012 ASCUS HPV neg  . HPV in female   . Hyperlipidemia   . Hypertension   . Miscarriage     Past Surgical History:  Procedure Laterality Date  . CERVICAL BIOPSY  W/ LOOP ELECTRODE EXCISION    . CESAREAN SECTION N/A 07/19/2013   Procedure: CESAREAN SECTION;  Surgeon: Allyn Kenner, DO;  Location: [facility];  Service: Obstetrics;  Laterality: N/A;  . KNEE ARTHROCENTESIS  plate and screws  . TUBAL LIGATION      Current Outpatient Medications  Medication Sig Dispense Refill  . Acetaminophen (TYLENOL PO) Take by mouth 3 times/day as needed-between meals & bedtime.    . cholecalciferol (VITAMIN D) 1000 units tablet Take 1,000 Units by mouth daily.    . DiphenhydrAMINE HCl (BENADRYL PO) Take by mouth as needed.    . hydrochlorothiazide (HYDRODIURIL) 12.5 MG tablet Take 12.5 mg by mouth every morning.    Marland Kitchen lisinopril (ZESTRIL) 10 MG tablet Take 10 mg by mouth daily.    .  Multiple Vitamins-Calcium (ONE-A-DAY WOMENS PO) Take 1 capsule by mouth daily.     No current facility-administered medications for this visit.     Family History  Problem Relation Age of Onset  . Hypertension Mother   . Diabetes Mother   . Breast cancer Mother 3  . Cancer Mother   . Stroke Father 54       smoker  . Hypertension Father   . Hypertension Maternal Grandmother   . Hypertension Maternal Grandfather   . Hypertension Sister   . Hearing loss Neg Hx     Review of Systems  All other systems reviewed and are negative.   Exam:   BP (!) 146/90 (Cuff Size: Large)   Pulse 76   Temp (!) 97 F (36.1 C) (Temporal)   Resp 14   Ht 5' 2.5" (1.588 m)   Wt 222 lb 12.8 oz (101.1 kg)   LMP 12/31/2018 (Exact Date)   BMI 40.10 kg/m     General appearance: alert, cooperative and appears stated age Head: normocephalic, without obvious abnormality, atraumatic Neck: no adenopathy, supple, symmetrical, trachea midline and thyroid normal to inspection and palpation Lungs: clear to auscultation bilaterally Breasts: normal appearance, no masses or tenderness, No nipple retraction or dimpling, No nipple discharge or bleeding, No axillary adenopathy Heart:  regular rate and rhythm Abdomen: soft, non-tender; no masses, no organomegaly Extremities: extremities normal, atraumatic, no cyanosis or edema Skin: skin color, texture, turgor normal. No rashes or lesions Lymph nodes: cervical, supraclavicular, and axillary nodes normal. Neurologic: grossly normal  Pelvic: External genitalia:  no lesions              No abnormal inguinal nodes palpated.              Urethra:  normal appearing urethra with no masses, tenderness or lesions              Bartholins and Skenes: normal                 Vagina: normal appearing vagina with normal color and discharge, no lesions              Cervix: no lesions.  Mild amount of old blood.               Pap taken: Yes.   Bimanual Exam:  Uterus:  normal  size, contour, position, consistency, mobility, non-tender              Adnexa: no mass, fullness, tenderness              Rectal exam: Yes.  .  Confirms.              Anus:  normal sphincter tone, no lesions  Chaperone was present for exam.  Assessment:   Well woman visit with normal exam. Hx BTL.  Hx LEEP.  FH of breast cancer in mother age 59.  Patient had negative BRCA testing. Former smoker.   Hypertension.    Hypercholesterolemia.   Colon cancer screening.  Plan: Mammogram screening discussed.  We discussed 3D. Self breast awareness reviewed. Pap and HR HPV done today. Guidelines for Calcium, Vitamin D, regular exercise program including cardiovascular and weight bearing exercise. Labs with PCP.  IFOB. Follow up annually and prn.   After visit summary provided.

## 2019-01-03 ENCOUNTER — Other Ambulatory Visit: Payer: Self-pay

## 2019-01-03 ENCOUNTER — Encounter: Payer: Self-pay | Admitting: Obstetrics and Gynecology

## 2019-01-03 ENCOUNTER — Other Ambulatory Visit (HOSPITAL_COMMUNITY)
Admission: RE | Admit: 2019-01-03 | Discharge: 2019-01-03 | Disposition: A | Discharge status: Home / Self Care | Payer: BC Managed Care – PPO | Source: Ambulatory Visit | Attending: Obstetrics and Gynecology | Admitting: Obstetrics and Gynecology

## 2019-01-03 ENCOUNTER — Ambulatory Visit: Payer: BC Managed Care – PPO | Admitting: Obstetrics and Gynecology

## 2019-01-03 ENCOUNTER — Other Ambulatory Visit: Payer: Self-pay | Admitting: Obstetrics and Gynecology

## 2019-01-03 VITALS — BP 146/90 | HR 76 | Temp 97.0°F | Resp 14 | Ht 62.5 in | Wt 222.8 lb

## 2019-01-03 DIAGNOSIS — Z1231 Encounter for screening mammogram for malignant neoplasm of breast: Secondary | ICD-10-CM

## 2019-01-03 DIAGNOSIS — Z01419 Encounter for gynecological examination (general) (routine) without abnormal findings: Secondary | ICD-10-CM

## 2019-01-03 DIAGNOSIS — Z1211 Encounter for screening for malignant neoplasm of colon: Secondary | ICD-10-CM | POA: Diagnosis not present

## 2019-01-03 NOTE — Patient Instructions (Signed)

## 2019-01-03 NOTE — Addendum Note (Signed)
Addended by: Yisroel Ramming, Travis Purk E on: 01/03/2019 11:43 AM   Modules accepted: Orders

## 2019-01-07 LAB — CYTOLOGY - PAP
Comment: NEGATIVE
Diagnosis: NEGATIVE
High risk HPV: NEGATIVE

## 2019-01-12 ENCOUNTER — Ambulatory Visit
Admission: RE | Admit: 2019-01-12 | Discharge: 2019-01-12 | Disposition: A | Discharge status: Home / Self Care | Payer: BC Managed Care – PPO | Source: Ambulatory Visit | Attending: Obstetrics and Gynecology | Admitting: Obstetrics and Gynecology

## 2019-01-12 ENCOUNTER — Other Ambulatory Visit: Payer: Self-pay

## 2019-01-12 DIAGNOSIS — Z1231 Encounter for screening mammogram for malignant neoplasm of breast: Secondary | ICD-10-CM

## 2019-01-12 DIAGNOSIS — Z1211 Encounter for screening for malignant neoplasm of colon: Secondary | ICD-10-CM | POA: Diagnosis not present

## 2019-01-13 LAB — FECAL OCCULT BLOOD, IMMUNOCHEMICAL: Fecal Occult Bld: NEGATIVE

## 2019-04-08 DIAGNOSIS — E7800 Pure hypercholesterolemia, unspecified: Secondary | ICD-10-CM | POA: Diagnosis not present

## 2019-04-08 DIAGNOSIS — I10 Essential (primary) hypertension: Secondary | ICD-10-CM | POA: Diagnosis not present

## 2019-04-08 DIAGNOSIS — Z131 Encounter for screening for diabetes mellitus: Secondary | ICD-10-CM | POA: Diagnosis not present

## 2019-04-27 ENCOUNTER — Encounter: Payer: Self-pay | Admitting: Certified Nurse Midwife

## 2019-10-12 DIAGNOSIS — Z23 Encounter for immunization: Secondary | ICD-10-CM | POA: Diagnosis not present

## 2019-10-12 DIAGNOSIS — I10 Essential (primary) hypertension: Secondary | ICD-10-CM | POA: Diagnosis not present

## 2019-12-07 ENCOUNTER — Other Ambulatory Visit: Payer: Self-pay | Admitting: Obstetrics and Gynecology

## 2019-12-07 DIAGNOSIS — Z1231 Encounter for screening mammogram for malignant neoplasm of breast: Secondary | ICD-10-CM

## 2020-01-17 NOTE — Progress Notes (Signed)
49 y.o. [id number] Married [place] American female here for annual exam.    Patient complaining of hot flashes--worse at night.  Menses are skipping.  Menses every other month last year.  No period this year from July to October, 2021.   Received her Covid booster and flu vaccine.  PCP:  [email or web address]    Patient's last menstrual period was 12/18/2019 (exact date).     Period Duration (Days): 3-5 days Period Pattern: (!) Irregular Menstrual Flow:  (first 2 days heavy then tapers) Menstrual Control: Maxi pad Menstrual Control Change Freq (Hours): changes maxi pad every 2 hours on heaviest day Dysmenorrhea: (!) Moderate (moderate to severe cramps) Dysmenorrhea Symptoms: Cramping,Headache     Sexually active: Yes.    The current method of family planning is tubal ligation.    Exercising: No.  walks when she has time Smoker:  no  Health Maintenance: Pap: 01-03-19 Neg:Neg HR HPV, 10-17-15 Neg, 10-05-14 Neg:Neg HR HPV  History of abnormal Pap:  Yes, 1992 hx of LEEP MMG:  01-12-19 Neg/density c/BiRads1--Appt. 01-18-20 Colonoscopy:  NEVER--01-12-19 Neg IFOB BMD: n/a  Result  n/a TDaP:  2015 Gardasil:   no HIV: Neg in preg Hep C: Unsure Screening Labs:  PCP   reports that she quit smoking about 22 months ago. Her smoking use included cigars. She has never used smokeless tobacco. She reports that she does not drink alcohol and does not use drugs.  Past Medical History:  Diagnosis Date  . Abnormal Pap smear 1992 2014   1992 Leep, 02/2012 ASCUS HPV neg  . HPV in female   . Hyperlipidemia   . Hypertension   . Miscarriage     Past Surgical History:  Procedure Laterality Date  . CERVICAL BIOPSY  W/ LOOP ELECTRODE EXCISION    . CESAREAN SECTION N/A 07/19/2013   Procedure: CESAREAN SECTION;  Surgeon: Allyn Kenner, DO;  Location: [facility];  Service: Obstetrics;  Laterality: N/A;  . KNEE ARTHROCENTESIS  plate and screws  . TUBAL LIGATION      Current Outpatient Medications   Medication Sig Dispense Refill  . Acetaminophen (TYLENOL PO) Take by mouth 3 times/day as needed-between meals & bedtime.    . cholecalciferol (VITAMIN D) 1000 units tablet Take 1,000 Units by mouth daily.    . DiphenhydrAMINE HCl (BENADRYL PO) Take by mouth as needed.    . hydrochlorothiazide (HYDRODIURIL) 12.5 MG tablet Take 12.5 mg by mouth every morning.    Marland Kitchen lisinopril (ZESTRIL) 10 MG tablet Take 10 mg by mouth daily.    . Multiple Vitamins-Calcium (ONE-A-DAY WOMENS PO) Take 1 capsule by mouth daily.     No current facility-administered medications for this visit.    Family History  Problem Relation Age of Onset  . Hypertension Mother   . Diabetes Mother   . Breast cancer Mother 73  . Cancer Mother   . Stroke Father 53       smoker  . Hypertension Father   . Hypertension Maternal Grandmother   . Hypertension Maternal Grandfather   . Hypertension Sister   . Hearing loss Neg Hx     Review of Systems  All other systems reviewed and are negative.   Exam:   BP 130/78 (Cuff Size: Large)   Pulse 94   Ht 5' 2.5" (1.588 m)   Wt 215 lb (97.5 kg)   LMP 12/18/2019 (Exact Date)   SpO2 99%   BMI 38.70 kg/m     General appearance: alert, cooperative and appears stated  age Head: normocephalic, without obvious abnormality, atraumatic Neck: no adenopathy, supple, symmetrical, trachea midline and thyroid normal to inspection and palpation Lungs: clear to auscultation bilaterally Breasts: normal appearance, no masses or tenderness, No nipple retraction or dimpling, No nipple discharge or bleeding, No axillary adenopathy Heart: regular rate and rhythm Abdomen: soft, non-tender; no masses, no organomegaly Extremities: extremities normal, atraumatic, no cyanosis or edema Skin: skin color, texture, turgor normal. No rashes or lesions Lymph nodes: cervical, supraclavicular, and axillary nodes normal. Neurologic: grossly normal  Pelvic: External genitalia:  no lesions              No  abnormal inguinal nodes palpated.              Urethra:  normal appearing urethra with no masses, tenderness or lesions              Bartholins and Skenes: normal                 Vagina: normal appearing vagina with normal color and discharge, no lesions              Cervix: no lesions.  Menses starting.               Pap taken: No. Bimanual Exam:  Uterus:  normal size, contour, position, consistency, mobility, non-tender              Adnexa: no mass, fullness, tenderness              Rectal exam: Yes.  .  Confirms.              Anus:  normal sphincter tone, no lesions  Chaperone was present for exam.  Assessment:   Well woman visit with normal exam. Hx BTL.  Hx LEEP. FH of breast cancer in mother age 38.  Patient had negative BRCA testing. Former smoker.  Hypertension.   Hypercholesterolemia. Menopausal symptoms.   Plan: Mammogram screening discussed.  Has appointment today. Self breast awareness reviewed. Pap and HR HPV as above. Guidelines for Calcium, Vitamin D, regular exercise program including cardiovascular and weight bearing exercise. We discussed HRT, SSRI/SNRI, Gabapentin, and Estroven.   She will pursue herbal options.  She will let me know if she goes for 3 months without a cycle.  Will then consider Provera withdrawal or FSH/estradiol. Brochure on menopause. IFOB. Form signed for work that she had her wellness exam this year.  Her labs were done through her PCP.  Follow up annually and prn.

## 2020-01-18 ENCOUNTER — Ambulatory Visit (INDEPENDENT_AMBULATORY_CARE_PROVIDER_SITE_OTHER): Payer: BC Managed Care – PPO | Admitting: Obstetrics and Gynecology

## 2020-01-18 ENCOUNTER — Other Ambulatory Visit: Payer: Self-pay

## 2020-01-18 ENCOUNTER — Ambulatory Visit
Admission: RE | Admit: 2020-01-18 | Discharge: 2020-01-18 | Disposition: A | Discharge status: Home / Self Care | Payer: BC Managed Care – PPO | Source: Ambulatory Visit | Attending: Obstetrics and Gynecology | Admitting: Obstetrics and Gynecology

## 2020-01-18 ENCOUNTER — Encounter: Payer: Self-pay | Admitting: Obstetrics and Gynecology

## 2020-01-18 VITALS — BP 130/78 | HR 94 | Ht 62.5 in | Wt 215.0 lb

## 2020-01-18 DIAGNOSIS — Z01419 Encounter for gynecological examination (general) (routine) without abnormal findings: Secondary | ICD-10-CM

## 2020-01-18 DIAGNOSIS — Z1231 Encounter for screening mammogram for malignant neoplasm of breast: Secondary | ICD-10-CM | POA: Diagnosis not present

## 2020-01-18 DIAGNOSIS — Z1211 Encounter for screening for malignant neoplasm of colon: Secondary | ICD-10-CM

## 2020-01-18 NOTE — Patient Instructions (Signed)

## 2020-01-23 ENCOUNTER — Other Ambulatory Visit: Payer: Self-pay

## 2020-01-23 DIAGNOSIS — Z1211 Encounter for screening for malignant neoplasm of colon: Secondary | ICD-10-CM | POA: Diagnosis not present

## 2020-01-24 ENCOUNTER — Other Ambulatory Visit: Payer: Self-pay | Admitting: Obstetrics and Gynecology

## 2020-01-24 DIAGNOSIS — R928 Other abnormal and inconclusive findings on diagnostic imaging of breast: Secondary | ICD-10-CM

## 2020-01-24 LAB — FECAL OCCULT BLOOD, IMMUNOCHEMICAL: Fecal Occult Bld: NEGATIVE

## 2020-02-08 ENCOUNTER — Other Ambulatory Visit: Payer: Self-pay

## 2020-02-08 ENCOUNTER — Ambulatory Visit
Admission: RE | Admit: 2020-02-08 | Discharge: 2020-02-08 | Disposition: A | Discharge status: Home / Self Care | Payer: BC Managed Care – PPO | Source: Ambulatory Visit | Attending: Obstetrics and Gynecology | Admitting: Obstetrics and Gynecology

## 2020-02-08 DIAGNOSIS — Z803 Family history of malignant neoplasm of breast: Secondary | ICD-10-CM | POA: Diagnosis not present

## 2020-02-08 DIAGNOSIS — R928 Other abnormal and inconclusive findings on diagnostic imaging of breast: Secondary | ICD-10-CM

## 2020-02-08 DIAGNOSIS — N6002 Solitary cyst of left breast: Secondary | ICD-10-CM | POA: Diagnosis not present

## 2020-09-19 NOTE — Progress Notes (Signed)
GYNECOLOGY  VISIT   HPI: 50 y.o.   Married  [place] American  female   [phone] with Patient's last menstrual period was 04/14/2020 (exact date).   here for missed menses. Last cycle was 5 months ago. Patient complaining of hot flashes. Not sleeping well, so is napping during the day.  Hot flashes are bothersome at night.  Some mood changes.  No vaginal dryness.   Wants to be able sleep.  UPT - negative.  GYNECOLOGIC HISTORY: Patient's last menstrual period was 04/14/2020 (exact date). Contraception:  Tubal Menopausal hormone therapy:  None Last mammogram:  01-18-20 Lt.Br.poss.mass;Rt.Br.Neg. Lt.Br.Diag.W/Lt.US/benign clustered cyst 5 o'clock, benign with benign simple cyst in outer subareolar at 3 o'clock/screening  67yrBiRads2 Last pap smear:  01-03-19 Neg:Neg HR HPV, 10-17-15 Neg, 10-05-14 Neg:Neg HR HPV         OB History     Gravida  3   Para  2   Term  0   Preterm  2   AB  1   Living  2      SAB  1   IAB      Ectopic      Multiple      Live Births  2              Patient Active Problem List   Diagnosis Date Noted   Need for influenza vaccination 02/13/2017   Essential hypertension, benign 10/01/2015   Hyperlipidemia 10/01/2015   Vitamin D deficiency 10/01/2015   Obesity 10/01/2015   Smoker 10/01/2015   Preeclampsia complicating hypertension 07/18/2013   Gestational hypertension 06/01/2013   PIH (pregnancy induced hypertension) 05/18/2013    Past Medical History:  Diagnosis Date   Abnormal Pap smear 1992 2014   1992 Leep, 02/2012 ASCUS HPV neg   HPV in female    Hyperlipidemia    Hypertension    Miscarriage     Past Surgical History:  Procedure Laterality Date   CERVICAL BIOPSY  W/ LOOP ELECTRODE EXCISION     CESAREAN SECTION N/A 07/19/2013   Procedure: CESAREAN SECTION;  Surgeon: SAllyn Kenner DO;  Location: [facility];  Service: Obstetrics;  Laterality: N/A;   KNEE ARTHROCENTESIS  plate and screws   TUBAL LIGATION      Current  Outpatient Medications  Medication Sig Dispense Refill   Acetaminophen (TYLENOL PO) Take by mouth 3 times/day as needed-between meals & bedtime.     DiphenhydrAMINE HCl (BENADRYL PO) Take by mouth as needed.     lisinopril-hydrochlorothiazide (ZESTORETIC) 10-12.5 MG tablet Take 1 tablet by mouth daily.     Multiple Vitamins-Calcium (ONE-A-DAY WOMENS PO) Take 1 capsule by mouth daily.     cholecalciferol (VITAMIN D) 1000 units tablet Take 1,000 Units by mouth daily.     No current facility-administered medications for this visit.     ALLERGIES: Aspirin  Family History  Problem Relation Age of Onset   Hypertension Mother    Diabetes Mother    Breast cancer Mother [phone]  Cancer Mother    Stroke Father [phone]      smoker   Hypertension Father    Hypertension Maternal Grandmother    Hypertension Maternal Grandfather    Hypertension Sister    Hearing loss Neg Hx     Social History   Socioeconomic History   Marital status: Married    Spouse name: Not on file   Number of children: Not on file   Years of education: Not on file   Highest education  level: Not on file  Occupational History   Not on file  Tobacco Use   Smoking status: Former    Types: Cigars    Quit date: 03/06/2018    Years since quitting: 2.5   Smokeless tobacco: Never   Tobacco comments:    smokes 1 cigar/day  Vaping Use   Vaping Use: Never used  Substance and Sexual Activity   Alcohol use: No   Drug use: No   Sexual activity: Yes    Partners: Male    Birth control/protection: Surgical    Comment: BTL  Other Topics Concern   Not on file  Social History Narrative   Not on file   Social Determinants of Health   Financial Resource Strain: Not on file  Food Insecurity: Not on file  Transportation Needs: Not on file  Physical Activity: Not on file  Stress: Not on file  Social Connections: Not on file  Intimate Partner Violence: Not on file    Review of Systems  Skin:        Hot flahses   All other  systems reviewed and are negative.  PHYSICAL EXAMINATION:    BP 120/76   Pulse 96   Ht 5' 2.5" (1.588 m)   Wt 215 lb (97.5 kg)   LMP 04/14/2020 (Exact Date)   SpO2 99%   BMI 38.70 kg/m     General appearance: alert, cooperative and appears stated age  ASSESSMENT  Menopausal symptoms.  FH breast cancer in mother.  Personal hx of negative genetic testing.   PLAN  Check FSH and estradiol. We discussed menopausal symptom relief:  herbal options, HRT, Paxil, Effexor, Neurontin.  Risks and benefits reviewed.  She will start with Estroven with melatonin.  She will try Neurontin if it is not successful.  I would start at 100 mg q hs x 3 nights and increase by 100 mg every 3 days to reach a total of 300 mg nightly.   She would need a recheck about 6 weeks later if she chooses this option.  Brochure on menopause to patient.    An After Visit Summary was printed and given to the patient.  21 min  total time was spent for this patient encounter, including preparation, face-to-face counseling with the patient, coordination of care, and documentation of the encounter.

## 2020-09-20 ENCOUNTER — Ambulatory Visit: Payer: BC Managed Care – PPO | Admitting: Obstetrics and Gynecology

## 2020-09-20 ENCOUNTER — Encounter: Payer: Self-pay | Admitting: Obstetrics and Gynecology

## 2020-09-20 ENCOUNTER — Other Ambulatory Visit: Payer: Self-pay

## 2020-09-20 VITALS — BP 120/76 | HR 96 | Ht 62.5 in | Wt 215.0 lb

## 2020-09-20 DIAGNOSIS — N926 Irregular menstruation, unspecified: Secondary | ICD-10-CM | POA: Diagnosis not present

## 2020-09-20 DIAGNOSIS — N951 Menopausal and female climacteric states: Secondary | ICD-10-CM

## 2020-09-20 LAB — ESTRADIOL: Estradiol: 19 pg/mL

## 2020-09-20 LAB — PREGNANCY, URINE: Preg Test, Ur: NEGATIVE

## 2020-09-20 LAB — FOLLICLE STIMULATING HORMONE: FSH: 59.4 m[IU]/mL

## 2020-09-21 ENCOUNTER — Ambulatory Visit: Payer: BC Managed Care – PPO | Admitting: Obstetrics and Gynecology

## 2020-10-15 DIAGNOSIS — Z23 Encounter for immunization: Secondary | ICD-10-CM | POA: Diagnosis not present

## 2020-10-15 DIAGNOSIS — E559 Vitamin D deficiency, unspecified: Secondary | ICD-10-CM | POA: Diagnosis not present

## 2020-10-15 DIAGNOSIS — Z131 Encounter for screening for diabetes mellitus: Secondary | ICD-10-CM | POA: Diagnosis not present

## 2020-10-15 DIAGNOSIS — I10 Essential (primary) hypertension: Secondary | ICD-10-CM | POA: Diagnosis not present

## 2020-10-15 DIAGNOSIS — Z0000 Encounter for general adult medical examination without abnormal findings: Secondary | ICD-10-CM | POA: Diagnosis not present

## 2020-10-15 DIAGNOSIS — E7800 Pure hypercholesterolemia, unspecified: Secondary | ICD-10-CM | POA: Diagnosis not present

## 2020-12-19 ENCOUNTER — Ambulatory Visit: Payer: BC Managed Care – PPO | Admitting: Obstetrics and Gynecology

## 2020-12-20 ENCOUNTER — Encounter: Payer: Self-pay | Admitting: Obstetrics and Gynecology

## 2020-12-20 ENCOUNTER — Other Ambulatory Visit: Payer: Self-pay

## 2020-12-20 ENCOUNTER — Ambulatory Visit: Payer: BC Managed Care – PPO | Admitting: Obstetrics and Gynecology

## 2020-12-20 VITALS — BP 132/80 | HR 78 | Ht 62.5 in | Wt 218.0 lb

## 2020-12-20 DIAGNOSIS — N924 Excessive bleeding in the premenopausal period: Secondary | ICD-10-CM

## 2020-12-20 DIAGNOSIS — N926 Irregular menstruation, unspecified: Secondary | ICD-10-CM | POA: Diagnosis not present

## 2020-12-20 LAB — PREGNANCY, URINE: Preg Test, Ur: NEGATIVE

## 2020-12-20 MED ORDER — MEDROXYPROGESTERONE ACETATE 10 MG PO TABS: 10.0000 mg | ORAL_TABLET | Freq: Every day | ORAL | 0 refills | Status: DC

## 2020-12-20 NOTE — Progress Notes (Signed)
GYNECOLOGY  VISIT   HPI: 50 y.o.   Married  [place] American  female   [phone] with Patient's last menstrual period was 11/28/2020 (exact date).   here for irregular bleeding.  Patient began spotting 11-28-20 and by 12-01-20 it was like a regular cycle. By 12-10-20 it became extremely heavy and bled 5-6 days. Pad change last week every 2 hours.  Stopped and began spotting again today.  Menses November 2021, 04/14/20, and April, 2022.  Had labs on 09/20/20:  estradiol 19 and FSH 59.4.  GYNECOLOGIC HISTORY: Patient's last menstrual period was 11/28/2020 (exact date). Contraception:  Tubal Menopausal hormone therapy:  n/a Last mammogram:  01-18-20 Lt.Br.poss.mass;Rt.Br.Neg. Lt.Br.Diag.W/Lt.US/benign clustered cyst 5 o'clock, benign with benign simple cyst in outer subareolar at 3 o'clock/screening  79yr/BiRads2 Last pap smear:   01-03-19 Neg:Neg HR HPV, 10-17-15 Neg, 10-05-14 Neg:Neg HR HPV         OB History     Gravida  3   Para  2   Term  0   Preterm  2   AB  1   Living  2      SAB  1   IAB      Ectopic      Multiple      Live Births  2              Patient Active Problem List   Diagnosis Date Noted   Need for influenza vaccination 02/13/2017   Essential hypertension, benign 10/01/2015   Hyperlipidemia 10/01/2015   Vitamin D deficiency 10/01/2015   Obesity 10/01/2015   Smoker 10/01/2015   Preeclampsia complicating hypertension 07/18/2013   Gestational hypertension 06/01/2013   PIH (pregnancy induced hypertension) 05/18/2013    Past Medical History:  Diagnosis Date   Abnormal Pap smear 1992 2014   1992 Leep, 02/2012 ASCUS HPV neg   HPV in female    Hyperlipidemia    Hypertension    Miscarriage     Past Surgical History:  Procedure Laterality Date   CERVICAL BIOPSY  W/ LOOP ELECTRODE EXCISION     CESAREAN SECTION N/A 07/19/2013   Procedure: CESAREAN SECTION;  Surgeon: Allyn Kenner, DO;  Location: [facility];  Service: Obstetrics;  Laterality: N/A;    KNEE ARTHROCENTESIS  plate and screws   TUBAL LIGATION      Current Outpatient Medications  Medication Sig Dispense Refill   Acetaminophen (TYLENOL PO) Take by mouth 3 times/day as needed-between meals & bedtime.     DiphenhydrAMINE HCl (BENADRYL PO) Take by mouth as needed.     lisinopril-hydrochlorothiazide (ZESTORETIC) 10-12.5 MG tablet 1 tablet     Multiple Vitamins-Calcium (ONE-A-DAY WOMENS PO) Take 1 capsule by mouth daily.     No current facility-administered medications for this visit.     ALLERGIES: Aspirin  Family History  Problem Relation Age of Onset   Hypertension Mother    Diabetes Mother    Breast cancer Mother 55   Cancer Mother    Stroke Father 78       smoker   Hypertension Father    Hypertension Maternal Grandmother    Hypertension Maternal Grandfather    Hypertension Sister    Hearing loss Neg Hx     Social History   Socioeconomic History   Marital status: Married    Spouse name: Not on file   Number of children: Not on file   Years of education: Not on file   Highest education level: Not on file  Occupational History  Not on file  Tobacco Use   Smoking status: Former    Types: Cigars    Quit date: 03/06/2018    Years since quitting: 2.7   Smokeless tobacco: Never   Tobacco comments:    smokes 1 cigar/day  Vaping Use   Vaping Use: Never used  Substance and Sexual Activity   Alcohol use: No   Drug use: No   Sexual activity: Yes    Partners: Male    Birth control/protection: Surgical    Comment: BTL  Other Topics Concern   Not on file  Social History Narrative   Not on file   Social Determinants of Health   Financial Resource Strain: Not on file  Food Insecurity: Not on file  Transportation Needs: Not on file  Physical Activity: Not on file  Stress: Not on file  Social Connections: Not on file  Intimate Partner Violence: Not on file    Review of Systems  Genitourinary:  Positive for menstrual problem (irregular bleeding).   All other systems reviewed and are negative.  PHYSICAL EXAMINATION:    BP 132/80   Pulse 78   Ht 5' 2.5" (1.588 m)   Wt 218 lb (98.9 kg)   LMP 11/28/2020 (Exact Date)   SpO2 100%   BMI 39.24 kg/m     Gen:  NAD.  Pelvic: External genitalia:  no lesions              Urethra:  normal appearing urethra with no masses, tenderness or lesions              Bartholins and Skenes: normal                 Vagina: normal appearing vagina with normal color and discharge, no lesions              Cervix: no lesions.  Old blood in the vagina.                Bimanual Exam:  Uterus:  normal size, contour, position, consistency, mobility, non-tender              Adnexa: no mass, fullness, tenderness           Chaperone was present for exam:  Raquel Sarna, RN  ASSESSMENT  Abnormal uterine bleeding.  Perimenopausal bleeding.  Status post BTL.  FH breast cancer in mother age 9.   PLAN  UPT now: negative.  We discussed treatment of irregular bleeding.  Oral progesterone and Mirena IUD.  She prefers Provera 10 mg x 10 days per month.  #30, RF 2.  Side effects discussed.  She will do UPT prior to taking each month. Fu for annual exam in January, 2023.    An After Visit Summary was printed and given to the patient.   22 min  total time was spent for this patient encounter, including preparation, face-to-face counseling with the patient, coordination of care, and documentation of the encounter.

## 2020-12-20 NOTE — Patient Instructions (Signed)
Medroxyprogesterone Tablets What is this medication? MEDROXYPROGESTERONE (me DROX ee proe JES te rone) treats irregular menstrual cycles. It may also be used to prevent the lining of the uterus from becoming too thick in people taking estrogen after menopause. It works by increasing levels of the hormone progestin in the body. This medication is a progestin hormone. This medicine may be used for other purposes; ask your health care provider or pharmacist if you have questions. COMMON BRAND NAME(S): Amen, Provera What should I tell my care team before I take this medication? They need to know if you have any of these conditions: Blood vessel disease or a history of a blood clot in the lungs or legs Breast, cervical, or vaginal cancer Heart disease Kidney disease Liver disease Migraine Recent miscarriage or abortion Mental depression Seizures Stroke Vaginal bleeding that has not been evaluated An unusual or allergic reaction to medroxyprogesterone, other medications, foods, dyes, or preservatives Pregnant or trying to get pregnant Breast-feeding How should I use this medication? Take this medication by mouth with a glass of water. Follow the directions on the prescription label. Take your doses at regular intervals. Do not take your medication more often than directed. Talk to your care team about the use of this medication in children. Special care may be needed. While this medication may be prescribed for children as young as 13 years for selected conditions, precautions do apply. Overdosage: If you think you have taken too much of this medicine contact a poison control center or emergency room at once. NOTE: This medicine is only for you. Do not share this medicine with others. What if I miss a dose? If you miss a dose, take it as soon as you can. If it is almost time for your next dose, take only that dose. Do not take double or extra doses. What may interact with this  medication? Barbiturate medications for inducing sleep or treating seizures Bosentan Carbamazepine Phenytoin Rifampin St. John's Wort This list may not describe all possible interactions. Give your health care provider a list of all the medicines, herbs, non-prescription drugs, or dietary supplements you use. Also tell them if you smoke, drink alcohol, or use illegal drugs. Some items may interact with your medicine. What should I watch for while using this medication? Visit your care team for regular checks on your progress. You will need a regular breast and pelvic exam. If you have any reason to think you are pregnant, stop taking this medication at once and contact your care team. What side effects may I notice from receiving this medication? Side effects that you should report to your care team as soon as possible: Allergic reactions--skin rash, itching, hives, swelling of the face, lips, tongue, or throat Blood clot--pain, swelling, or warmth in the leg, shortness of breath, chest pain Breast tissue changes, new lumps, redness, pain, or discharge from the nipple Gallbladder problems--severe stomach pain, nausea, vomiting, fever Increase in blood pressure Liver injury--right upper belly pain, loss of appetite, nausea, light-colored stool, dark yellow or brown urine, yellowing skin or eyes, unusual weakness or fatigue Stroke--sudden numbness or weakness of the face, arm, or leg, trouble speaking, confusion, trouble walking, loss of balance or coordination, dizziness, severe headache, change in vision Sudden eye pain or change in vision such as blurry vision, seeing halos around lights, vision loss Unusual vaginal discharge, itching, or odor Vaginal bleeding after menopause, pelvic pain Worsening mood, feelings of depression Side effects that usually do not require medical attention (report  to your care team if they continue or are bothersome): Breast pain or tenderness Change in sex  drive or performance Headache Irregular menstrual cycles or spotting Nausea Weight gain This list may not describe all possible side effects. Call your doctor for medical advice about side effects. You may report side effects to FDA at 1-800-FDA-1088. Where should I keep my medication? Keep out of the reach of children and pets. Store at room temperature between 20 and 25 degrees C (68 and 77 degrees F). Throw away any unused medication after the expiration date. NOTE: This sheet is a summary. It may not cover all possible information. If you have questions about this medicine, talk to your doctor, pharmacist, or health care provider.  2022 Elsevier/Gold Standard (2020-03-25 00:00:00)

## 2021-01-03 ENCOUNTER — Other Ambulatory Visit: Payer: Self-pay | Admitting: Obstetrics and Gynecology

## 2021-01-03 DIAGNOSIS — Z1231 Encounter for screening mammogram for malignant neoplasm of breast: Secondary | ICD-10-CM

## 2021-02-05 ENCOUNTER — Ambulatory Visit: Payer: BC Managed Care – PPO

## 2021-02-08 ENCOUNTER — Ambulatory Visit
Admission: RE | Admit: 2021-02-08 | Discharge: 2021-02-08 | Disposition: A | Discharge status: Home / Self Care | Payer: BC Managed Care – PPO | Source: Ambulatory Visit | Attending: Obstetrics and Gynecology | Admitting: Obstetrics and Gynecology

## 2021-02-08 ENCOUNTER — Other Ambulatory Visit: Payer: Self-pay

## 2021-02-08 DIAGNOSIS — Z1231 Encounter for screening mammogram for malignant neoplasm of breast: Secondary | ICD-10-CM | POA: Diagnosis not present

## 2021-02-11 NOTE — Progress Notes (Signed)
51 y.o. [id number] Married [place] American female here for annual exam.    Seen in November for abnormal perimenopausal bleeding.  She was given a prescription for Provera 10 mg x 10 days per month to regulate cycles.   Bleed from 11/28/20 - 12/20/20.  After she took Provera, the bleeding stopped immediately.  Bleeding returned just after Christmas and took another course of Provera started around the 12/27 or 12/28.  She still sees blood with wiping.   Having night sweats, not during the day.   Started spotting just after Christmas.  PCP: Lujean Amel, MD  Patient's last menstrual period was 02/01/2021 (exact date).           Sexually active: Yes.    The current method of family planning is tubal ligation.    Exercising: No.   Walking 2x/week Smoker:  no  Health Maintenance: Pap:   01-03-19 Neg:Neg HR HPV, 10-17-15 Neg, 10-05-14 Neg:Neg HR HPV  History of abnormal Pap:  Yes, 1992 hx of LEEP MMG:  02-08-21 Neg/BiRads1 Colonoscopy:  NEVER--Scheduled 03-19-21 BMD:   n/a  Result  n/a TDaP:  2015 Gardasil:   no HIV: Neg in preg Hep C: Unsure Screening Labs:  PCP   reports that she quit smoking about 2 years ago. Her smoking use included cigars. She has never used smokeless tobacco. She reports that she does not drink alcohol and does not use drugs.  Past Medical History:  Diagnosis Date   Abnormal Pap smear 1992 2014   [place], 02/2012 ASCUS HPV neg   HPV in female    Hyperlipidemia    Hypertension    Miscarriage     Past Surgical History:  Procedure Laterality Date   CERVICAL BIOPSY  W/ LOOP ELECTRODE EXCISION     CESAREAN SECTION N/A 07/19/2013   Procedure: CESAREAN SECTION;  Surgeon: Allyn Kenner, DO;  Location: [facility];  Service: Obstetrics;  Laterality: N/A;   KNEE ARTHROCENTESIS  plate and screws   TUBAL LIGATION      Current Outpatient Medications  Medication Sig Dispense Refill   Acetaminophen (TYLENOL PO) Take by mouth 3 times/day as needed-between meals &  bedtime.     Cholecalciferol (VITAMIN D3) 10 MCG (400 UNIT) CAPS Take by mouth.     DiphenhydrAMINE HCl (BENADRYL PO) Take by mouth as needed.     lisinopril-hydrochlorothiazide (ZESTORETIC) 10-12.5 MG tablet 1 tablet     medroxyPROGESTERone (PROVERA) 10 MG tablet Take 1 tablet (10 mg total) by mouth daily. Take for 10 days per month.  This will induce a menstruation. 30 tablet 0   Multiple Vitamins-Calcium (ONE-A-DAY WOMENS PO) Take 1 capsule by mouth daily.     No current facility-administered medications for this visit.    Family History  Problem Relation Age of Onset   Hypertension Mother    Diabetes Mother    Breast cancer Mother 26   Cancer Mother    Stroke Father 64       smoker   Hypertension Father    Hypertension Maternal Grandmother    Hypertension Maternal Grandfather    Hypertension Sister    Hearing loss Neg Hx     Review of Systems  All other systems reviewed and are negative.  Exam:   BP 112/64    Pulse 79    Ht 5\' 2"  (1.575 m)    Wt 221 lb (100.2 kg)    LMP 02/01/2021 (Exact Date) Comment: spotting   SpO2 97%    BMI 40.42 kg/m  General appearance: alert, cooperative and appears stated age Head: normocephalic, without obvious abnormality, atraumatic Neck: no adenopathy, supple, symmetrical, trachea midline and thyroid normal to inspection and palpation Lungs: clear to auscultation bilaterally Breasts: normal appearance, no masses or tenderness, No nipple retraction or dimpling, No nipple discharge or bleeding, No axillary adenopathy Heart: regular rate and rhythm Abdomen: soft, non-tender; no masses, no organomegaly Extremities: extremities normal, atraumatic, no cyanosis or edema Skin: skin color, texture, turgor normal. No rashes or lesions Lymph nodes: cervical, supraclavicular, and axillary nodes normal. Neurologic: grossly normal  Pelvic: External genitalia:  no lesions              No abnormal inguinal nodes palpated.              Urethra:  normal  appearing urethra with no masses, tenderness or lesions              Bartholins and Skenes: normal                 Vagina: normal appearing vagina with normal color and discharge, no lesions              Cervix: no lesions.  Dark blood noted in the vagina.               Pap taken: no Bimanual Exam:  Uterus:  normal size, contour, position, consistency, mobility, non-tender              Adnexa: no mass, fullness, tenderness              Rectal exam: Declined.   Chaperone was present for exam:  Estill Bamberg, CMA  Assessment:   Well woman visit with gynecologic exam. Hx BTL.  Status post LEEP in 1992. Abnormal perimenopausal bleeding.  FH breast cancer in mother.  Personal negative genetic testing.   Plan: Mammogram screening discussed. Self breast awareness reviewed. Pap and HR HPV 2025. Guidelines for Calcium, Vitamin D, regular exercise program including cardiovascular and weight bearing exercise. Labs with PCP.  Return for pelvic ultrasound and possible EMB.  Rationale explained.    Follow up annually and prn.   After visit summary provided.

## 2021-02-13 ENCOUNTER — Ambulatory Visit (INDEPENDENT_AMBULATORY_CARE_PROVIDER_SITE_OTHER): Payer: BC Managed Care – PPO | Admitting: Obstetrics and Gynecology

## 2021-02-13 ENCOUNTER — Encounter: Payer: Self-pay | Admitting: Obstetrics and Gynecology

## 2021-02-13 ENCOUNTER — Other Ambulatory Visit: Payer: Self-pay

## 2021-02-13 VITALS — BP 112/64 | HR 79 | Ht 62.0 in | Wt 221.0 lb

## 2021-02-13 DIAGNOSIS — Z01419 Encounter for gynecological examination (general) (routine) without abnormal findings: Secondary | ICD-10-CM | POA: Diagnosis not present

## 2021-02-13 DIAGNOSIS — N924 Excessive bleeding in the premenopausal period: Secondary | ICD-10-CM

## 2021-02-13 NOTE — Patient Instructions (Signed)

## 2021-03-12 ENCOUNTER — Other Ambulatory Visit: Payer: BC Managed Care – PPO | Admitting: Obstetrics and Gynecology

## 2021-03-12 ENCOUNTER — Other Ambulatory Visit: Payer: BC Managed Care – PPO

## 2021-03-14 ENCOUNTER — Other Ambulatory Visit: Payer: BC Managed Care – PPO | Admitting: Obstetrics and Gynecology

## 2021-03-14 ENCOUNTER — Other Ambulatory Visit: Payer: BC Managed Care – PPO

## 2021-03-18 ENCOUNTER — Telehealth: Payer: Self-pay | Admitting: *Deleted

## 2021-03-18 ENCOUNTER — Encounter: Payer: Self-pay | Admitting: *Deleted

## 2021-03-18 NOTE — Telephone Encounter (Signed)
Letter printed and faxed to the below #

## 2021-03-18 NOTE — Telephone Encounter (Signed)
Patient called she is on her husband health insurance. She was seen on 02/13/21 for annual exam. Patient reports his insurance company require a note from provider office to confirming patient was seen for visit.   I was going to provide a note stating " Bridget Fields was seen at my office (Dr. Quincy Simmonds) on 02/13/21 for her yearly Gyn exam"  She asked note be faxed to [phone]  Please advise

## 2021-03-18 NOTE — Telephone Encounter (Signed)
Please fax this letter for patient.

## 2021-03-19 DIAGNOSIS — D125 Benign neoplasm of sigmoid colon: Secondary | ICD-10-CM | POA: Diagnosis not present

## 2021-03-19 DIAGNOSIS — Z1211 Encounter for screening for malignant neoplasm of colon: Secondary | ICD-10-CM | POA: Diagnosis not present

## 2021-03-19 DIAGNOSIS — D128 Benign neoplasm of rectum: Secondary | ICD-10-CM | POA: Diagnosis not present

## 2021-03-19 NOTE — Telephone Encounter (Signed)
Confirmation via fax received.

## 2021-04-19 DIAGNOSIS — E7800 Pure hypercholesterolemia, unspecified: Secondary | ICD-10-CM | POA: Diagnosis not present

## 2021-05-09 ENCOUNTER — Telehealth: Payer: Self-pay | Admitting: *Deleted

## 2021-05-09 NOTE — Telephone Encounter (Signed)
-----   Message from Sinclair Grooms sent at 05/08/2021  3:46 PM EDT ----- ?Regarding: cancelled appt ?We cancelled Feb 7 appointment and rescheduled her for Feb 9. Patient cancelled her Feb 9 appointment. I spoke with patient today and she stated she does not want to reschedule at this time she would call us when she is ready to reschedule.  ? ?

## 2021-05-09 NOTE — Telephone Encounter (Signed)
PUS for abnormal uterine bleeding was ordered at [facility] 02/13/21.  ? ?Patient declines to reschedule at this time.  ? ?Routing to Dr. Quincy Simmonds to advise.  ? ? ?

## 2021-05-09 NOTE — Telephone Encounter (Signed)
Encounter reviewed and closed.  

## 2021-05-30 DIAGNOSIS — E7800 Pure hypercholesterolemia, unspecified: Secondary | ICD-10-CM | POA: Diagnosis not present

## 2022-02-05 ENCOUNTER — Other Ambulatory Visit: Payer: Self-pay | Admitting: Obstetrics and Gynecology

## 2022-02-05 DIAGNOSIS — Z1231 Encounter for screening mammogram for malignant neoplasm of breast: Secondary | ICD-10-CM

## 2022-02-05 NOTE — Progress Notes (Signed)
52 y.o. G3P0212 Married [place] American female here for annual exam.    Menses lasted 3 days in August.  No bleeding since then. Having hot flashes. Uses a fan at night.  Not seeking treatment for menopausal symptoms.   Turning 52 yo and plans to celebrate.  PCP:   Dr. Dorthy Cooler  Patient's last menstrual period was 09/03/2021 (approximate).     Period Duration (Days): 2 Period Pattern: (!) Irregular Menstrual Flow: Light     Sexually active: Yes.    The current method of family planning is tubal ligation.    Exercising: Yes.     2-3 days of walking. Smoker:  former  Health Maintenance: Pap:  01-03-19 Neg:Neg HR HPV, 10-17-15 Neg, 10-05-14 Neg:Neg HR HPV   History of abnormal Pap:  yes, 1992 hx of LEEP  MMG:  02-08-21 Breast Density Category C, BI-RADS CATEGORY 1,   Colonoscopy:  2023, five polyps, goes back in 3 years per pt BMD:   n/a  Result  n/a TDaP:  2015 Gardasil:   no HIV: neg in preg Hep C: unsure Screening Labs:  PCP Flu vaccine:  completed.  Covid vaccine:  completed.   reports that she quit smoking about 3 years ago. Her smoking use included cigars. She has never used smokeless tobacco. She reports that she does not drink alcohol and does not use drugs.  Past Medical History:  Diagnosis Date   Abnormal Pap smear 1992 2014   [place], 02/2012 ASCUS HPV neg   HPV in female    Hyperlipidemia    Hypertension    Miscarriage     Past Surgical History:  Procedure Laterality Date   CERVICAL BIOPSY  W/ LOOP ELECTRODE EXCISION     CESAREAN SECTION N/A 07/19/2013   Procedure: CESAREAN SECTION;  Surgeon: Allyn Kenner, DO;  Location: [facility];  Service: Obstetrics;  Laterality: N/A;   KNEE ARTHROCENTESIS  plate and screws   TUBAL LIGATION      Current Outpatient Medications  Medication Sig Dispense Refill   Acetaminophen (TYLENOL PO) Take by mouth 3 times/day as needed-between meals & bedtime.     atorvastatin (LIPITOR) 10 MG tablet      DiphenhydrAMINE HCl  (BENADRYL PO) Take by mouth as needed.     lisinopril-hydrochlorothiazide (ZESTORETIC) 10-12.5 MG tablet 1 tablet     Multiple Vitamins-Calcium (ONE-A-DAY WOMENS PO) Take 1 capsule by mouth daily.     medroxyPROGESTERone (PROVERA) 10 MG tablet Take 1 tablet (10 mg total) by mouth daily. Take for 10 days per month.  This will induce a menstruation. (Patient not taking: Reported on 02/17/2022) 30 tablet 0   No current facility-administered medications for this visit.    Family History  Problem Relation Age of Onset   Hypertension Mother    Diabetes Mother    Breast cancer Mother 88   Cancer Mother    Stroke Father 7       smoker   Hypertension Father    Hypertension Maternal Grandmother    Hypertension Maternal Grandfather    Hypertension Sister    Hearing loss Neg Hx     Review of Systems  All other systems reviewed and are negative.   Exam:   BP 120/80 (BP Location: Right Arm, Patient Position: Sitting, Cuff Size: Large)   Pulse 86   Ht 5' 2.5" (1.588 m)   Wt 221 lb (100.2 kg)   LMP 09/03/2021 (Approximate)   SpO2 100%   BMI 39.78 kg/m  General appearance: alert, cooperative and appears stated age Head: normocephalic, without obvious abnormality, atraumatic Neck: no adenopathy, supple, symmetrical, trachea midline and thyroid normal to inspection and palpation Lungs: clear to auscultation bilaterally Breasts: normal appearance, no masses or tenderness, No nipple retraction or dimpling, No nipple discharge or bleeding, No axillary adenopathy Heart: regular rate and rhythm Abdomen: soft, non-tender; no masses, no organomegaly Extremities: extremities normal, atraumatic, no cyanosis or edema Skin: skin color, texture, turgor normal. No rashes or lesions Lymph nodes: cervical, supraclavicular, and axillary nodes normal. Neurologic: grossly normal  Pelvic: External genitalia:  no lesions              No abnormal inguinal nodes palpated.              Urethra:  normal  appearing urethra with no masses, tenderness or lesions              Bartholins and Skenes: normal                 Vagina: normal appearing vagina with normal color and discharge, no lesions              Cervix: no lesions              Pap taken: no Bimanual Exam:  Uterus:  normal size, contour, position, consistency, mobility, non-tender              Adnexa: no mass, fullness, tenderness              Rectal exam: declined  Chaperone was present for exam:  Raquel Sarna  Assessment:   Well woman visit with gynecologic exam. Hx BTL.  Status post LEEP in 1992. Menopausal symptoms.  FH breast cancer in mother.  Personal negative genetic testing.   Plan: Mammogram screening discussed. Self breast awareness reviewed. Pap and HR HPV 2027 Guidelines for Calcium, Vitamin D, regular exercise program including cardiovascular and weight bearing exercise. Will do a course of Provera 5 mg x 5 days to see if she has a withdrawal bleed.  She will let me know if she has a period or not.  Follow up annually and prn.   After visit summary provided.

## 2022-02-17 ENCOUNTER — Encounter: Payer: Self-pay | Admitting: Obstetrics and Gynecology

## 2022-02-17 ENCOUNTER — Ambulatory Visit (INDEPENDENT_AMBULATORY_CARE_PROVIDER_SITE_OTHER): Payer: BC Managed Care – PPO | Admitting: Obstetrics and Gynecology

## 2022-02-17 VITALS — BP 120/80 | HR 86 | Ht 62.5 in | Wt 221.0 lb

## 2022-02-17 DIAGNOSIS — Z01419 Encounter for gynecological examination (general) (routine) without abnormal findings: Secondary | ICD-10-CM

## 2022-02-17 MED ORDER — MEDROXYPROGESTERONE ACETATE 5 MG PO TABS: 5.0000 mg | ORAL_TABLET | Freq: Every day | ORAL | 0 refills | Status: DC

## 2022-02-17 NOTE — Patient Instructions (Signed)

## 2022-03-27 ENCOUNTER — Ambulatory Visit
Admission: RE | Admit: 2022-03-27 | Discharge: 2022-03-27 | Disposition: A | Discharge status: Home / Self Care | Payer: BC Managed Care – PPO | Source: Ambulatory Visit | Attending: Obstetrics and Gynecology | Admitting: Obstetrics and Gynecology

## 2022-03-27 DIAGNOSIS — Z1231 Encounter for screening mammogram for malignant neoplasm of breast: Secondary | ICD-10-CM | POA: Diagnosis not present

## 2022-05-30 IMAGING — MG MM DIGITAL SCREENING BILAT W/ TOMO AND CAD
8 series · 8 of 24 positions shown · non-contrast
Comparison: Previous exam(s).

CLINICAL DATA: Screening.

EXAM:
DIGITAL SCREENING BILATERAL MAMMOGRAM WITH TOMOSYNTHESIS AND CAD
TECHNIQUE: Bilateral screening digital craniocaudal and mediolateral oblique
mammograms were obtained. Bilateral screening digital breast
tomosynthesis was performed. The images were evaluated with
computer-aided detection.

[L MLO synth-2D]
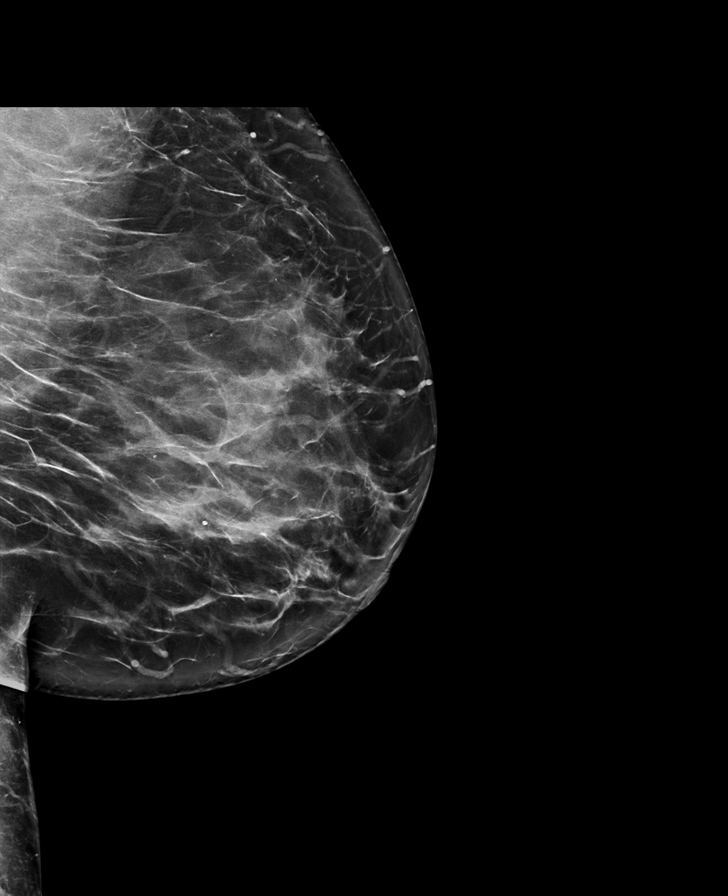

[L CC synth-2D]
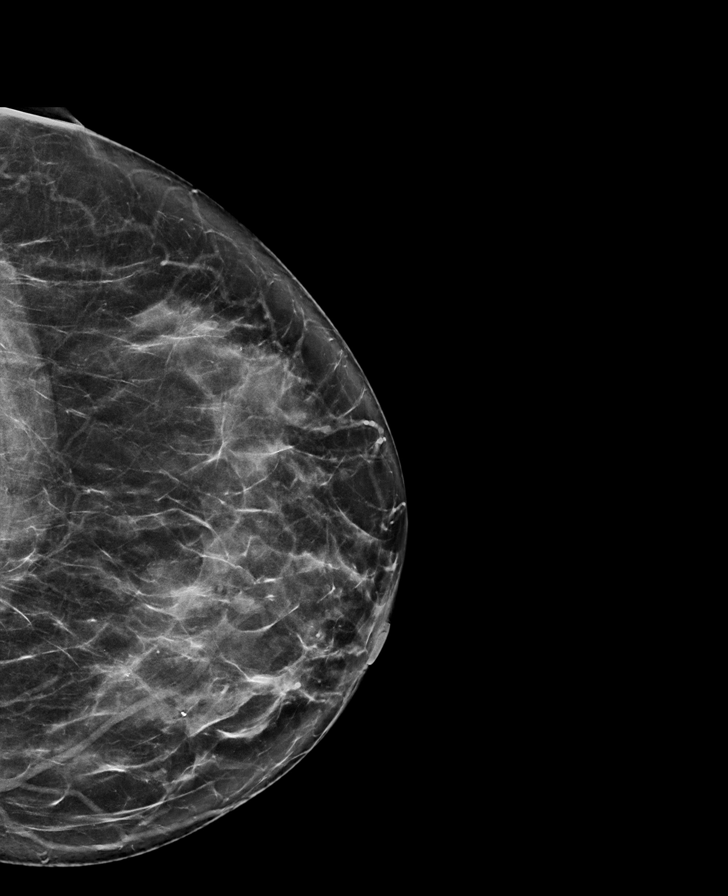

[R MLO synth-2D]
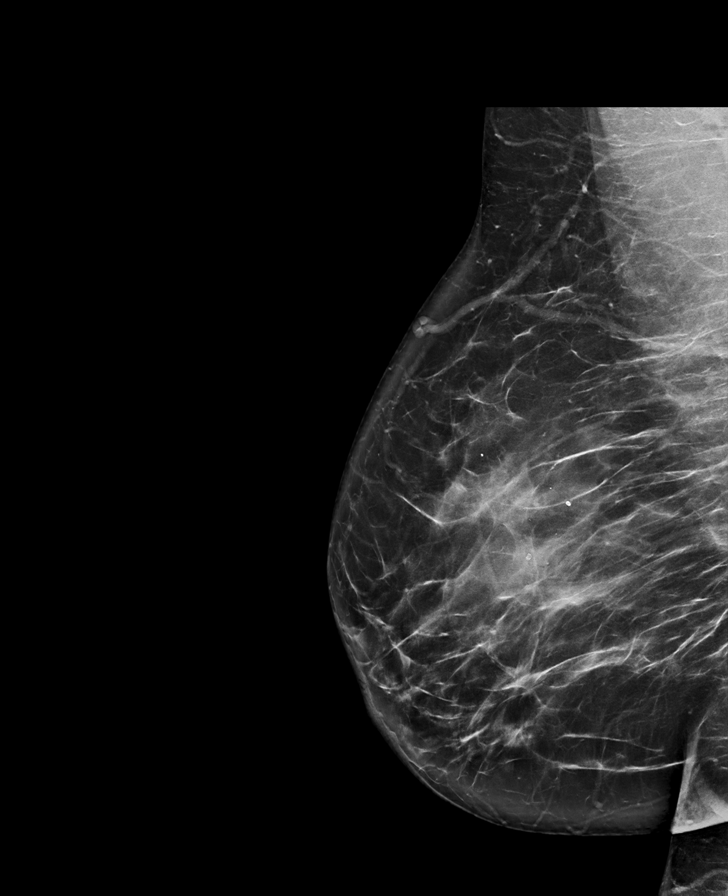

[R CC synth-2D]
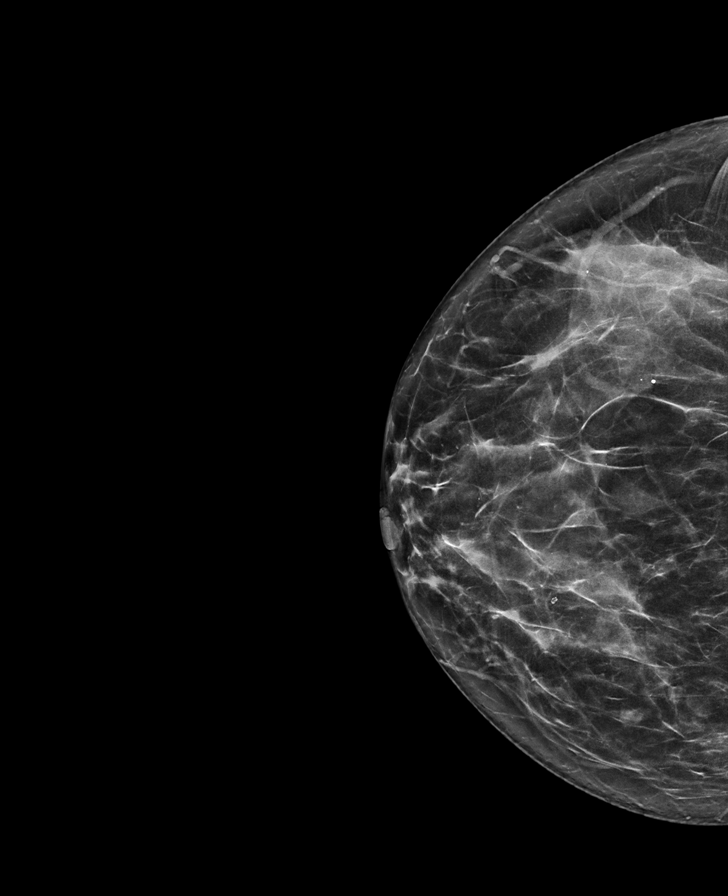

[L MLO tomo · tomo slice 42/83.0]
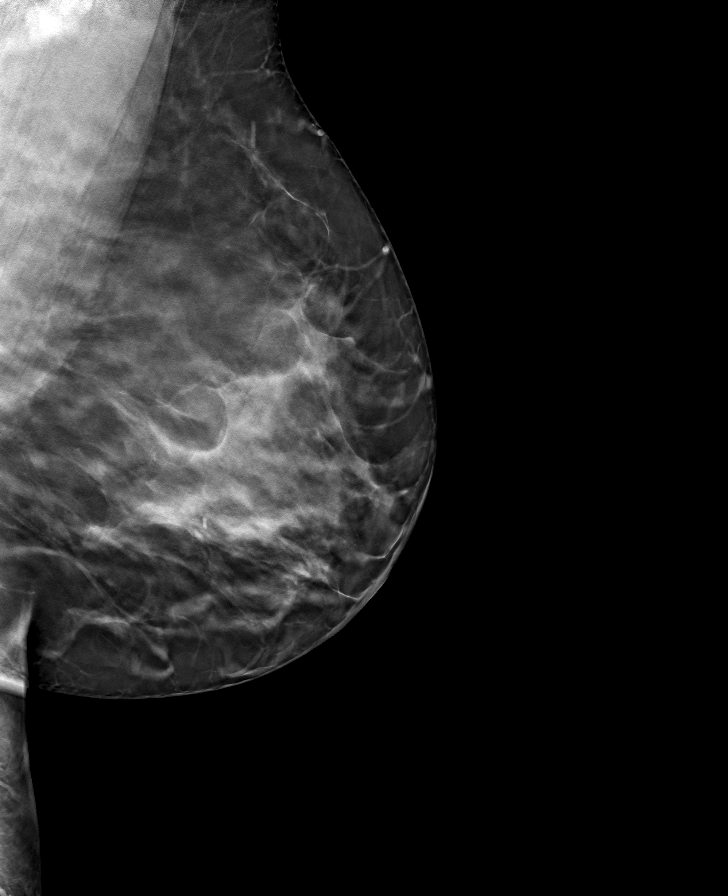

[R MLO tomo · tomo slice 43/85.0]
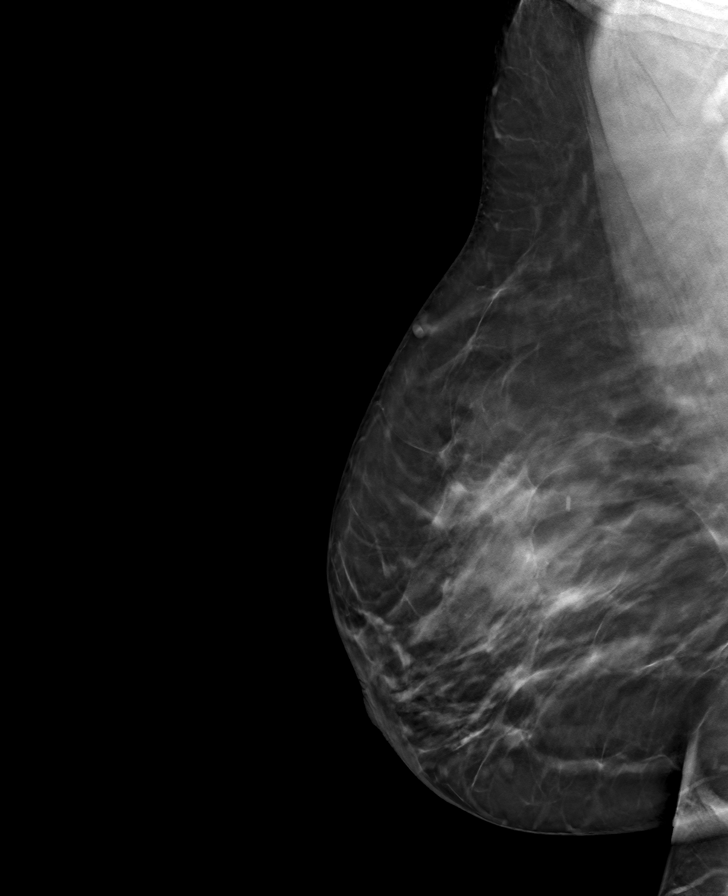

[L CC tomo · tomo slice 38/75.0]
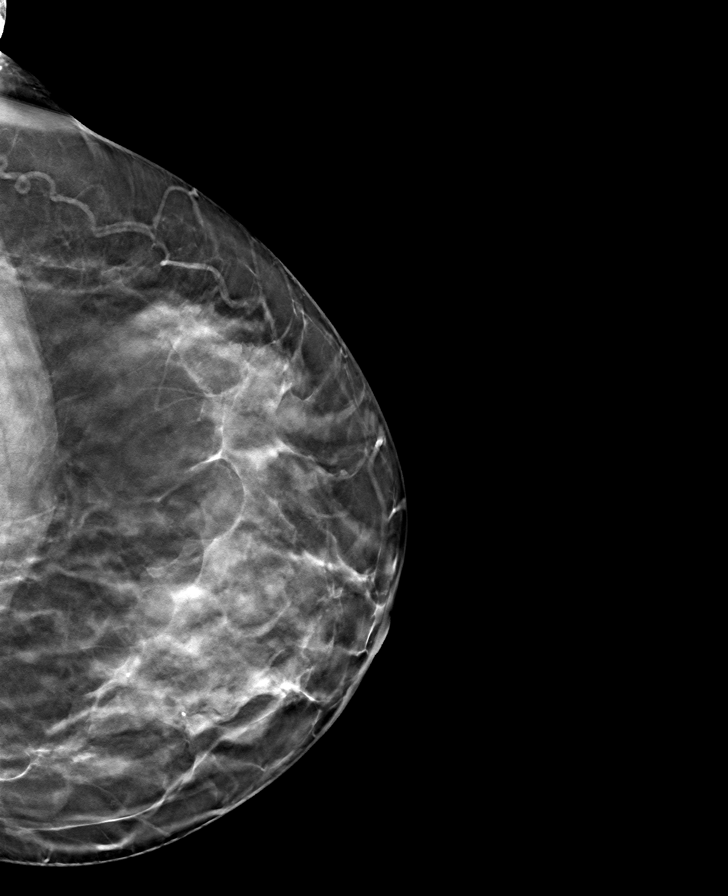

[R CC tomo · tomo slice 35/70.0]
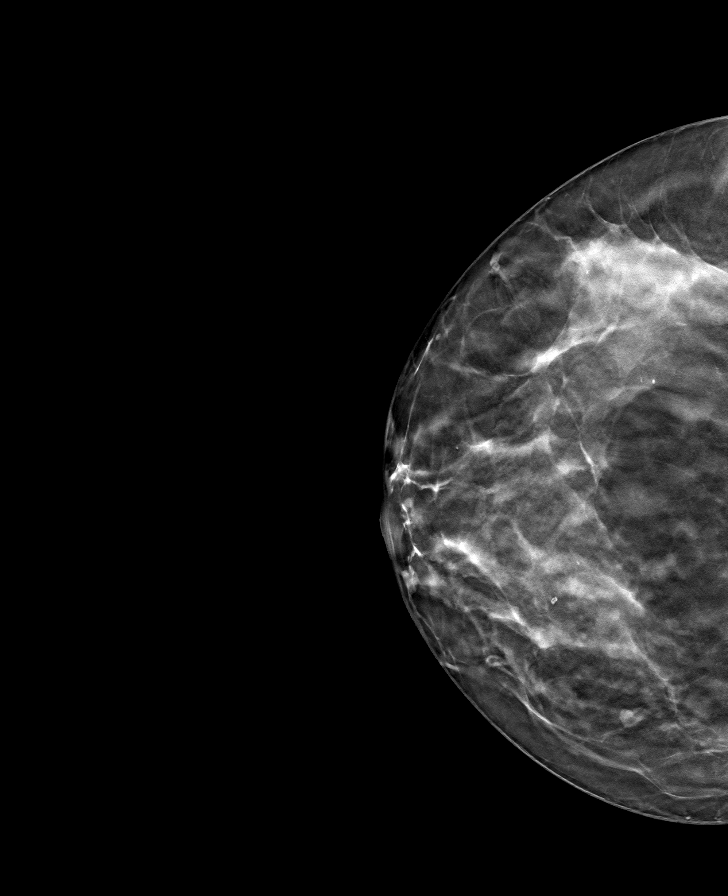

[8 of 24 positions shown; findings below may reference images not displayed]

ACR Breast Density Category c: The breast tissue is heterogeneously
dense, which may obscure small masses.
FINDINGS: There are no findings suspicious for malignancy.
IMPRESSION: No mammographic evidence of malignancy. A result letter of this
screening mammogram will be mailed directly to the patient.

RECOMMENDATION:
Screening mammogram in one year. (Code:Q3-W-BC3)

BI-RADS CATEGORY  1: Negative.

## 2022-11-27 DIAGNOSIS — E559 Vitamin D deficiency, unspecified: Secondary | ICD-10-CM | POA: Diagnosis not present

## 2022-11-27 DIAGNOSIS — Z0000 Encounter for general adult medical examination without abnormal findings: Secondary | ICD-10-CM | POA: Diagnosis not present

## 2022-11-27 DIAGNOSIS — I10 Essential (primary) hypertension: Secondary | ICD-10-CM | POA: Diagnosis not present

## 2022-11-27 DIAGNOSIS — E7800 Pure hypercholesterolemia, unspecified: Secondary | ICD-10-CM | POA: Diagnosis not present

## 2022-11-27 DIAGNOSIS — Z23 Encounter for immunization: Secondary | ICD-10-CM | POA: Diagnosis not present

## 2022-11-28 ENCOUNTER — Other Ambulatory Visit: Payer: Self-pay | Admitting: Obstetrics and Gynecology

## 2022-11-28 DIAGNOSIS — Z0000 Encounter for general adult medical examination without abnormal findings: Secondary | ICD-10-CM

## 2023-02-23 ENCOUNTER — Ambulatory Visit: Payer: BC Managed Care – PPO | Admitting: Obstetrics and Gynecology

## 2023-03-30 ENCOUNTER — Ambulatory Visit: Payer: BC Managed Care – PPO

## 2023-06-05 ENCOUNTER — Ambulatory Visit
Admission: RE | Admit: 2023-06-05 | Discharge: 2023-06-05 | Disposition: A | Discharge status: Home / Self Care | Source: Ambulatory Visit | Attending: Obstetrics and Gynecology | Admitting: Obstetrics and Gynecology

## 2023-06-05 DIAGNOSIS — Z1231 Encounter for screening mammogram for malignant neoplasm of breast: Secondary | ICD-10-CM | POA: Diagnosis not present

## 2023-06-05 DIAGNOSIS — Z0000 Encounter for general adult medical examination without abnormal findings: Secondary | ICD-10-CM

## 2023-06-09 ENCOUNTER — Encounter: Payer: Self-pay | Admitting: Obstetrics and Gynecology

## 2023-09-08 DIAGNOSIS — Z0120 Encounter for dental examination and cleaning without abnormal findings: Secondary | ICD-10-CM | POA: Diagnosis not present

## 2023-10-20 DIAGNOSIS — Z0120 Encounter for dental examination and cleaning without abnormal findings: Secondary | ICD-10-CM | POA: Diagnosis not present

## 2024-01-13 ENCOUNTER — Ambulatory Visit: Payer: Self-pay | Admitting: Nurse Practitioner

## 2024-01-13 ENCOUNTER — Encounter: Payer: Self-pay | Admitting: Nurse Practitioner

## 2024-01-13 VITALS — BP 112/73 | HR 80 | Wt 222.0 lb

## 2024-01-13 DIAGNOSIS — Z0000 Encounter for general adult medical examination without abnormal findings: Secondary | ICD-10-CM | POA: Diagnosis not present

## 2024-01-13 DIAGNOSIS — E559 Vitamin D deficiency, unspecified: Secondary | ICD-10-CM | POA: Diagnosis not present

## 2024-01-13 DIAGNOSIS — I10 Essential (primary) hypertension: Secondary | ICD-10-CM

## 2024-01-13 DIAGNOSIS — E785 Hyperlipidemia, unspecified: Secondary | ICD-10-CM

## 2024-01-13 DIAGNOSIS — Z23 Encounter for immunization: Secondary | ICD-10-CM

## 2024-01-13 MED ORDER — LISINOPRIL-HYDROCHLOROTHIAZIDE 10-12.5 MG PO TABS: 1.0000 | ORAL_TABLET | Freq: Every day | ORAL | 2 refills | Status: AC

## 2024-01-13 MED ORDER — ATORVASTATIN CALCIUM 10 MG PO TABS: 10.0000 mg | ORAL_TABLET | Freq: Every day | ORAL | 1 refills | Status: AC

## 2024-01-13 NOTE — Assessment & Plan Note (Addendum)
 General Health Maintenance Up to date on Pap smear, next PAP due in 2027  up to date with mammogram. Received shingles vaccine. - Encouraged pneumonia vaccine if not previously received. Received flu vaccine in the office today

## 2024-01-13 NOTE — Assessment & Plan Note (Signed)
 Managed with atorvastatin 10 mg daily. No recent lipid panel results available. - Continue atorvastatin 10 mg daily. - Ordered lipid panel to assess cholesterol levels.

## 2024-01-13 NOTE — Assessment & Plan Note (Signed)
°-   Ordered vitamin D  level to assess current status.

## 2024-01-13 NOTE — Progress Notes (Signed)
 New Patient Office Visit  Subjective:  Patient ID: Bridget Fields, female    DOB: 17-May-1970  Age: 53 y.o. MRN: [id number]  CC:  Chief Complaint  Patient presents with   Establish Care    HPI    Discussed the use of AI scribe software for clinical note transcription with the patient, who gave verbal consent to proceed.  History of Present Illness Bridget Fields is a 53 year old female who presents to establish care for her chronic medical conditions.  Previous PCP was at [facility]     She has a history of hypertension and hyperlipidemia, for which she is taking atorvastatin 10 mg daily and lisinopril-hydrochlorothiazide  10-12.5 mg daily. She also takes a women's one-a-day multivitamin and 10,000 IU of vitamin D  weekly. No recent lipid panel or vitamin D  level check has been performed since her last physical in October of the previous year.   In terms of social history, she quit smoking cigars four years ago after previously smoking cigarettes and cigars. She does not currently use drugs, drink alcohol, or vape. She lives with her husband and ten-year-old daughter, and has an older daughter and two grandchildren living in [name] .  She is up to date with her Pap smears and mammograms, and has received the shingles vaccine. She is unsure about the pneumonia vaccine and does not recall receiving it. She receives a flu shot annually.  She denies any complaints today   Assessment & Plan      Past Medical History:  Diagnosis Date   Abnormal Pap smear 1992 2014   1992 [place], 02/2012 ASCUS HPV neg   HPV in female    Hyperlipidemia    Hypertension    Miscarriage     Past Surgical History:  Procedure Laterality Date   CERVICAL BIOPSY  W/ LOOP ELECTRODE EXCISION     CESAREAN SECTION N/A 07/19/2013   Procedure: CESAREAN SECTION;  Surgeon: Donna Just, DO;  Location: [facility];  Service: Obstetrics;  Laterality: N/A;   KNEE ARTHROCENTESIS  plate and screws   TUBAL  LIGATION      Family History  Problem Relation Age of Onset   Hypertension Mother    Diabetes Mother    Breast cancer Mother 47   Cancer Mother    Stroke Father 45       smoker   Hypertension Father    Hypertension Maternal Grandmother    Hypertension Maternal Grandfather    Hypertension Sister    Hearing loss Neg Hx     Social History   Socioeconomic History   Marital status: Married    Spouse name: Not on file   Number of children: 2   Years of education: Not on file   Highest education level: Associate degree: occupational, scientist, product/process development, or vocational program  Occupational History   Not on file  Tobacco Use   Smoking status: Former    Current packs/day: 0.00    Types: Cigars, Cigarettes    Quit date: 03/06/2018    Years since quitting: 5.8   Smokeless tobacco: Never   Tobacco comments:    smokes 1 cigar/day  Vaping Use   Vaping status: Never Used  Substance and Sexual Activity   Alcohol use: Never   Drug use: Never   Sexual activity: Yes    Partners: Male    Birth control/protection: Post-menopausal, Surgical    Comment: BTL  Other Topics Concern   Not on file  Social History Narrative   Lives  with her husband and daughter    Social Drivers of Corporate Investment Banker Strain: Low Risk  (01/12/2024)   Overall Financial Resource Strain (CARDIA)    Difficulty of Paying Living Expenses: Not hard at all  Food Insecurity: No Food Insecurity (01/12/2024)   Hunger Vital Sign    Worried About Running Out of Food in the Last Year: Never true    Ran Out of Food in the Last Year: Never true  Transportation Needs: No Transportation Needs (01/12/2024)   PRAPARE - Administrator, Civil Service (Medical): No    Lack of Transportation (Non-Medical): No  Physical Activity: Insufficiently Active (01/12/2024)   Exercise Vital Sign    Days of Exercise per Week: 2 days    Minutes of Exercise per Session: 30 min  Stress: Stress Concern Present (01/12/2024)    [employer] of Occupational Health - Occupational Stress Questionnaire    Feeling of Stress: To some extent  Social Connections: Unknown (01/12/2024)   Social Connection and Isolation Panel    Frequency of Communication with Friends and Family: Twice a week    Frequency of Social Gatherings with Friends and Family: Patient declined    Attends Religious Services: Patient declined    Database Administrator or Organizations: No    Attends Engineer, Structural: Not on file    Marital Status: Married  Catering Manager Violence: Not on file    ROS Review of Systems  Constitutional:  Negative for appetite change, chills, fatigue and fever.  HENT:  Negative for congestion, postnasal drip, rhinorrhea and sneezing.   Respiratory:  Negative for cough, shortness of breath and wheezing.   Cardiovascular:  Negative for chest pain, palpitations and leg swelling.  Gastrointestinal:  Negative for abdominal pain, constipation, nausea and vomiting.  Genitourinary:  Negative for difficulty urinating, dysuria, flank pain and frequency.  Musculoskeletal:  Negative for arthralgias, back pain, joint swelling and myalgias.  Skin:  Negative for color change, pallor, rash and wound.  Neurological:  Negative for dizziness, facial asymmetry, weakness, numbness and headaches.  Psychiatric/Behavioral:  Negative for behavioral problems, confusion, self-injury and suicidal ideas.     Objective:   Today's Vitals: BP 112/73   Pulse 80   Wt 222 lb (100.7 kg)   LMP 09/03/2021 (Approximate)   SpO2 100%   BMI 39.96 kg/m   Physical Exam Vitals and nursing note reviewed.  Constitutional:      General: She is not in acute distress.    Appearance: Normal appearance. She is obese. She is not ill-appearing, toxic-appearing or diaphoretic.  Eyes:     General: No scleral icterus.       Right eye: No discharge.        Left eye: No discharge.     Extraocular Movements: Extraocular movements intact.      Conjunctiva/sclera: Conjunctivae normal.  Cardiovascular:     Rate and Rhythm: Normal rate and regular rhythm.     Pulses: Normal pulses.     Heart sounds: Normal heart sounds. No murmur heard.    No friction rub. No gallop.  Pulmonary:     Effort: Pulmonary effort is normal. No respiratory distress.     Breath sounds: Normal breath sounds. No stridor. No wheezing, rhonchi or rales.  Chest:     Chest wall: No tenderness.  Abdominal:     General: There is no distension.     Palpations: Abdomen is soft.     Tenderness: There is no  abdominal tenderness. There is no right CVA tenderness, left CVA tenderness or guarding.  Musculoskeletal:        General: No swelling, tenderness, deformity or signs of injury.     Right lower leg: No edema.     Left lower leg: No edema.  Skin:    General: Skin is warm and dry.     Capillary Refill: Capillary refill takes less than 2 seconds.     Coloration: Skin is not jaundiced or pale.     Findings: No bruising, erythema or lesion.  Neurological:     Mental Status: She is alert and oriented to person, place, and time.     Motor: No weakness.     Gait: Gait normal.  Psychiatric:        Mood and Affect: Mood normal.        Behavior: Behavior normal.        Thought Content: Thought content normal.        Judgment: Judgment normal.     Assessment & Plan:   Problem List Items Addressed This Visit       Cardiovascular and Mediastinum   Essential hypertension, benign - Primary   BP Readings from Last 3 Encounters:  01/13/24 112/73  02/17/22 120/80  02/13/21 112/64   Controlled on lisinopril-hydrochlorothiazide  10-12.5 mg daily  medication refilled Continue current medications. No changes in management. Discussed DASH diet and dietary sodium restrictions Continue to increase dietary efforts and exercise.         Relevant Medications   atorvastatin (LIPITOR) 10 MG tablet   lisinopril-hydrochlorothiazide  (ZESTORETIC) 10-12.5 MG tablet    Other Relevant Orders   CBC   CMP14+EGFR     Other   Hyperlipidemia   Managed with atorvastatin 10 mg daily. No recent lipid panel results available. - Continue atorvastatin 10 mg daily. - Ordered lipid panel to assess cholesterol levels.      Relevant Medications   atorvastatin (LIPITOR) 10 MG tablet   lisinopril-hydrochlorothiazide  (ZESTORETIC) 10-12.5 MG tablet   Other Relevant Orders   Lipid panel   Vitamin D  deficiency    - Ordered vitamin D  level to assess current status.        Relevant Orders   VITAMIN D  25 Hydroxy (Vit-D Deficiency, Fractures)   Need for influenza vaccination   Relevant Orders   Flu vaccine trivalent PF, 6mos and older(Flulaval,Afluria,Fluarix,Fluzone)   Health care maintenance   General Health Maintenance Up to date on Pap smear, next PAP due in 2027  up to date with mammogram. Received shingles vaccine. - Encouraged pneumonia vaccine if not previously received. Received flu vaccine in the office today        Outpatient Encounter Medications as of 01/13/2024  Medication Sig   Acetaminophen  (TYLENOL  PO) Take by mouth 3 times/day as needed-between meals & bedtime.   DiphenhydrAMINE  HCl (BENADRYL  PO) Take by mouth as needed.   Multiple Vitamins-Calcium  (ONE-A-DAY WOMENS PO) Take 1 capsule by mouth daily.   [DISCONTINUED] atorvastatin (LIPITOR) 10 MG tablet    [DISCONTINUED] lisinopril-hydrochlorothiazide  (ZESTORETIC) 10-12.5 MG tablet 1 tablet   atorvastatin (LIPITOR) 10 MG tablet Take 1 tablet (10 mg total) by mouth daily.   lisinopril-hydrochlorothiazide  (ZESTORETIC) 10-12.5 MG tablet Take 1 tablet by mouth daily.   [DISCONTINUED] medroxyPROGESTERone  (PROVERA ) 5 MG tablet Take 1 tablet (5 mg total) by mouth daily. Take for 5 days.   No facility-administered encounter medications on file as of 01/13/2024.    Follow-up: Return in about 6 months (around 07/13/2024) for  HTN, HYPERLIPIDEMIA.   Tommy Minichiello R Alberta Cairns, FNP

## 2024-01-13 NOTE — Assessment & Plan Note (Addendum)
 BP Readings from Last 3 Encounters:  01/13/24 112/73  02/17/22 120/80  02/13/21 112/64   Controlled on lisinopril-hydrochlorothiazide  10-12.5 mg daily  medication refilled Continue current medications. No changes in management. Discussed DASH diet and dietary sodium restrictions Continue to increase dietary efforts and exercise.

## 2024-01-13 NOTE — Patient Instructions (Signed)

## 2024-01-14 LAB — CMP14+EGFR
ALT: 18 IU/L (ref 0–32)
AST: 22 IU/L (ref 0–40)
Albumin: 4.3 g/dL (ref 3.8–4.9)
Alkaline Phosphatase: 101 IU/L (ref 49–135)
BUN/Creatinine Ratio: 13 (ref 9–23)
BUN: 10 mg/dL (ref 6–24)
Bilirubin Total: 0.5 mg/dL (ref 0.0–1.2)
CO2: 23 mmol/L (ref 20–29)
Calcium: 9.9 mg/dL (ref 8.7–10.2)
Chloride: 105 mmol/L (ref 96–106)
Creatinine, Ser: 0.8 mg/dL (ref 0.57–1.00)
Globulin, Total: 2.8 g/dL (ref 1.5–4.5)
Glucose: 83 mg/dL (ref 70–99)
Potassium: 4.3 mmol/L (ref 3.5–5.2)
Sodium: 141 mmol/L (ref 134–144)
Total Protein: 7.1 g/dL (ref 6.0–8.5)
eGFR: 89 mL/min/1.73 (ref >59)

## 2024-01-14 LAB — CBC
Hematocrit: 42.9 % (ref 34.0–46.6)
Hemoglobin: 13.8 g/dL (ref 11.1–15.9)
MCH: 29.7 pg (ref 26.6–33.0)
MCHC: 32.2 g/dL (ref 31.5–35.7)
MCV: 92 fL (ref 79–97)
Platelets: 321 x10E3/uL (ref 150–450)
RBC: 4.65 x10E6/uL (ref 3.77–5.28)
RDW: 12.5 % (ref 11.7–15.4)
WBC: 7.5 x10E3/uL (ref 3.4–10.8)

## 2024-01-14 LAB — LIPID PANEL
Chol/HDL Ratio: 3.2 ratio (ref 0.0–4.4)
Cholesterol, Total: 207 mg/dL — ABNORMAL HIGH (ref 100–199)
HDL: 64 mg/dL (ref >39)
LDL Chol Calc (NIH): 128 mg/dL — ABNORMAL HIGH (ref 0–99)
Triglycerides: 86 mg/dL (ref 0–149)
VLDL Cholesterol Cal: 15 mg/dL (ref 5–40)

## 2024-01-14 LAB — VITAMIN D 25 HYDROXY (VIT D DEFICIENCY, FRACTURES): Vit D, 25-Hydroxy: 48.3 ng/mL (ref 30.0–100.0)

## 2024-01-15 ENCOUNTER — Ambulatory Visit: Payer: Self-pay | Admitting: Nurse Practitioner

## 2024-07-13 ENCOUNTER — Ambulatory Visit: Payer: Self-pay | Admitting: Nurse Practitioner
